# Patient Record
Sex: Female | Born: 1990 | Race: Black or African American | Hispanic: No | Marital: Single | Smoking: Never smoker
Health system: Southern US, Community
[De-identification: ages and names within clinical notes are randomized; demographics above are authoritative.]

## PROBLEM LIST (undated history)

## (undated) DIAGNOSIS — I10 Essential (primary) hypertension: Secondary | ICD-10-CM

## (undated) DIAGNOSIS — D649 Anemia, unspecified: Secondary | ICD-10-CM

## (undated) DIAGNOSIS — E7800 Pure hypercholesterolemia, unspecified: Secondary | ICD-10-CM

## (undated) HISTORY — DX: Pure hypercholesterolemia, unspecified: E78.00

---

## 2017-01-10 HISTORY — PX: TUBAL LIGATION: SHX77

## 2017-07-16 ENCOUNTER — Ambulatory Visit: Payer: Self-pay | Admitting: Obstetrics and Gynecology

## 2017-09-09 ENCOUNTER — Other Ambulatory Visit (HOSPITAL_COMMUNITY)
Admission: RE | Admit: 2017-09-09 | Discharge: 2017-09-09 | Disposition: A | Discharge status: Home / Self Care | Payer: Medicaid Other | Source: Ambulatory Visit | Attending: Obstetrics and Gynecology | Admitting: Obstetrics and Gynecology

## 2017-09-09 ENCOUNTER — Encounter: Payer: Self-pay | Admitting: Obstetrics and Gynecology

## 2017-09-09 ENCOUNTER — Ambulatory Visit (INDEPENDENT_AMBULATORY_CARE_PROVIDER_SITE_OTHER): Payer: Medicaid Other | Admitting: Obstetrics and Gynecology

## 2017-09-09 DIAGNOSIS — Z01411 Encounter for gynecological examination (general) (routine) with abnormal findings: Secondary | ICD-10-CM | POA: Insufficient documentation

## 2017-09-09 DIAGNOSIS — N938 Other specified abnormal uterine and vaginal bleeding: Secondary | ICD-10-CM | POA: Insufficient documentation

## 2017-09-09 DIAGNOSIS — Z202 Contact with and (suspected) exposure to infections with a predominantly sexual mode of transmission: Secondary | ICD-10-CM | POA: Insufficient documentation

## 2017-09-09 DIAGNOSIS — Z309 Encounter for contraceptive management, unspecified: Secondary | ICD-10-CM

## 2017-09-09 MED ORDER — DESOGESTREL-ETHINYL ESTRADIOL 0.15-30 MG-MCG PO TABS: 1.0000 | ORAL_TABLET | Freq: Every day | ORAL | 11 refills | Status: DC

## 2017-09-09 NOTE — Progress Notes (Signed)
Sonya Floyd is a 27 y.o. G63P3003 female here for a routine annual gynecologic exam. She reports a history of irreglar cycles but they have become more irregular since her BTL last May. Cycles are monthly but at different times. Heavy, cramps and clots. Sexual active without problems. H/O c section x 3. Moved here from [place] and does not have a PCP yet.  Gynecologic History Patient's last menstrual period was 08/25/2017. Contraception: tubal ligation Last Pap: 2018. Results were: normal Last mammogram: NA. Results were: NA  Obstetric History OB History  Gravida Para Term Preterm AB Living  3 3 3     3   SAB TAB Ectopic Multiple Live Births          3    # Outcome Date GA Lbr Len/2nd Weight Sex Delivery Anes PTL Lv  3 Term 01/20/15     CS-LTranv   LIV  2 Term 07/11/11     CS-LTranv   LIV  1 Term 11/06/09     CS-LTranv   LIV      Past Medical History:  Diagnosis Date  . High cholesterol     Past Surgical History:  Procedure Laterality Date  . CESAREAN SECTION  11/06/2009  . CESAREAN SECTION  07/11/2011  . CESAREAN SECTION  01/20/2015  . TUBAL LIGATION  01/2017    Current Outpatient Medications on File Prior to Visit  Medication Sig Dispense Refill  . ferrous sulfate 325 (65 FE) MG tablet Take 325 mg by mouth daily with breakfast.    . SUMAtriptan (IMITREX) 50 MG tablet Take 50 mg by mouth every 2 (two) hours as needed for migraine. May repeat in 2 hours if headache persists or recurs.     No current facility-administered medications on file prior to visit.     Not on File  Social History   Socioeconomic History  . Marital status: Single    Spouse name: Not on file  . Number of children: Not on file  . Years of education: Not on file  . Highest education level: Not on file  Social Needs  . Financial resource strain: Not on file  . Food insecurity - worry: Not on file  . Food insecurity - inability: Not on file  . Transportation needs - medical: Not on file  .  Transportation needs - non-medical: Not on file  Occupational History  . Not on file  Tobacco Use  . Smoking status: Never Smoker  . Smokeless tobacco: Never Used  Substance and Sexual Activity  . Alcohol use: Yes    Comment: occ  . Drug use: No  . Sexual activity: Yes    Partners: Male    Birth control/protection: None  Other Topics Concern  . Not on file  Social History Narrative  . Not on file    History reviewed. No pertinent family history.  The following portions of the patient's history were reviewed and updated as appropriate: allergies, current medications, past family history, past medical history, past social history, past surgical history and problem list.  Review of Systems Pertinent items noted in HPI and remainder of comprehensive ROS otherwise negative.   Objective:  BP 131/86   Pulse 73   Ht 5\' 2"  (1.575 m)   Wt 182 lb 9.6 oz (82.8 kg)   LMP 08/25/2017   BMI 33.40 kg/m  CONSTITUTIONAL: Well-developed, well-nourished female in no acute distress.  HENT:  Normocephalic, atraumatic, External right and left ear normal. Oropharynx is clear and moist EYES: Conjunctivae  and EOM are normal. Pupils are equal, round, and reactive to light. No scleral icterus.  NECK: Normal range of motion, supple, no masses.  Normal thyroid.  SKIN: Skin is warm and dry. No rash noted. Not diaphoretic. No erythema. No pallor. NEUROLGIC: Alert and oriented to person, place, and time. Normal reflexes, muscle tone coordination. No cranial nerve deficit noted. PSYCHIATRIC: Normal mood and affect. Normal behavior. Normal judgment and thought content. CARDIOVASCULAR: Normal heart rate noted, regular rhythm RESPIRATORY: Clear to auscultation bilaterally. Effort and breath sounds normal, no problems with respiration noted. BREASTS: Symmetric in size. No masses, skin changes, nipple drainage, or lymphadenopathy. ABDOMEN: Soft, normal bowel sounds, no distention noted.  No tenderness, rebound  or guarding.  PELVIC: Normal appearing external genitalia; normal appearing vaginal mucosa and cervix.  No abnormal discharge noted.  Pap smear obtained.  Normal uterine size, no other palpable masses, no uterine or adnexal tenderness. MUSCULOSKELETAL: Normal range of motion. No tenderness.  No cyanosis, clubbing, or edema.  2+ distal pulses.   Assessment:  Annual gynecologic examination with pap smear STD exposure   DUB   Plan:  Will follow up results of pap smear and manage accordingly. STD testing. Discussed DUB. Hormonal manipulation with OCP's recommended to pt. U/S/B reviewed with pt.  Routine preventative health maintenance measures emphasized. Please refer to After Visit Summary for other counseling recommendations.    Hermina StaggersMichael L Ervin, MD, FACOG Attending Obstetrician & Gynecologist [facility]

## 2017-09-09 NOTE — Patient Instructions (Signed)

## 2017-09-10 LAB — CERVICOVAGINAL ANCILLARY ONLY
Chlamydia: NEGATIVE
Neisseria Gonorrhea: NEGATIVE

## 2017-09-10 LAB — RPR: RPR Ser Ql: NONREACTIVE

## 2017-09-10 LAB — CYTOLOGY - PAP
ADEQUACY: ABSENT
Diagnosis: NEGATIVE

## 2017-09-10 LAB — HIV ANTIBODY (ROUTINE TESTING W REFLEX): HIV Screen 4th Generation wRfx: NONREACTIVE

## 2017-09-16 ENCOUNTER — Ambulatory Visit: Payer: Self-pay | Admitting: Obstetrics and Gynecology

## 2017-10-29 ENCOUNTER — Emergency Department (HOSPITAL_COMMUNITY): Payer: Self-pay

## 2017-10-29 ENCOUNTER — Encounter (HOSPITAL_COMMUNITY): Payer: Self-pay | Admitting: Family Medicine

## 2017-10-29 DIAGNOSIS — R1013 Epigastric pain: Secondary | ICD-10-CM | POA: Insufficient documentation

## 2017-10-29 DIAGNOSIS — Z793 Long term (current) use of hormonal contraceptives: Secondary | ICD-10-CM | POA: Insufficient documentation

## 2017-10-29 DIAGNOSIS — R112 Nausea with vomiting, unspecified: Secondary | ICD-10-CM | POA: Insufficient documentation

## 2017-10-29 LAB — CBC
HEMATOCRIT: 32.6 % — AB (ref 36.0–46.0)
Hemoglobin: 9.7 g/dL — ABNORMAL LOW (ref 12.0–15.0)
MCH: 20.9 pg — ABNORMAL LOW (ref 26.0–34.0)
MCHC: 29.8 g/dL — ABNORMAL LOW (ref 30.0–36.0)
MCV: 70.1 fL — AB (ref 78.0–100.0)
Platelets: ADEQUATE 10*3/uL (ref 150–400)
RBC: 4.65 MIL/uL (ref 3.87–5.11)
RDW: 17.3 % — ABNORMAL HIGH (ref 11.5–15.5)
WBC: 7.9 10*3/uL (ref 4.0–10.5)

## 2017-10-29 LAB — BASIC METABOLIC PANEL
ANION GAP: 8 (ref 5–15)
BUN: 11 mg/dL (ref 6–20)
CO2: 27 mmol/L (ref 22–32)
Calcium: 8.9 mg/dL (ref 8.9–10.3)
Chloride: 111 mmol/L (ref 101–111)
Creatinine, Ser: 0.73 mg/dL (ref 0.44–1.00)
GFR calc non Af Amer: >60 mL/min (ref >60)
Glucose, Bld: 87 mg/dL (ref 65–99)
POTASSIUM: 3.5 mmol/L (ref 3.5–5.1)
SODIUM: 146 mmol/L — AB (ref 135–145)

## 2017-10-29 LAB — I-STAT BETA HCG BLOOD, ED (MC, WL, AP ONLY)

## 2017-10-29 LAB — I-STAT TROPONIN, ED: Troponin i, poc: 0 ng/mL (ref 0.00–0.08)

## 2017-10-29 NOTE — ED Triage Notes (Signed)
Patient is complaining of lower mid sternal chest pain that started while getting dressed on Monday morning. Associated symptoms are shortness of breath, dizziness, lightheadedness, diaphoresis, nausea, vomiting, and back pain. Patient appears in no acute distress. Respirations are even, regular, and unlabored. Skin is warm, dry, and intact.

## 2017-10-30 ENCOUNTER — Emergency Department (HOSPITAL_COMMUNITY)
Admission: EM | Admit: 2017-10-30 | Discharge: 2017-10-30 | Disposition: A | Discharge status: Home / Self Care | Payer: Self-pay | Attending: Emergency Medicine | Admitting: Emergency Medicine

## 2017-10-30 ENCOUNTER — Emergency Department (HOSPITAL_COMMUNITY): Payer: Self-pay

## 2017-10-30 DIAGNOSIS — R1011 Right upper quadrant pain: Secondary | ICD-10-CM

## 2017-10-30 DIAGNOSIS — R1013 Epigastric pain: Secondary | ICD-10-CM

## 2017-10-30 LAB — COMPREHENSIVE METABOLIC PANEL
ALBUMIN: 3.8 g/dL (ref 3.5–5.0)
ALK PHOS: 58 U/L (ref 38–126)
ALT: 42 U/L (ref 14–54)
ANION GAP: 9 (ref 5–15)
AST: 27 U/L (ref 15–41)
BILIRUBIN TOTAL: 0.4 mg/dL (ref 0.3–1.2)
BUN: 10 mg/dL (ref 6–20)
CO2: 26 mmol/L (ref 22–32)
Calcium: 8.7 mg/dL — ABNORMAL LOW (ref 8.9–10.3)
Chloride: 106 mmol/L (ref 101–111)
Creatinine, Ser: 0.67 mg/dL (ref 0.44–1.00)
GFR calc Af Amer: >60 mL/min (ref >60)
GFR calc non Af Amer: >60 mL/min (ref >60)
GLUCOSE: 96 mg/dL (ref 65–99)
Potassium: 3.4 mmol/L — ABNORMAL LOW (ref 3.5–5.1)
Sodium: 141 mmol/L (ref 135–145)
TOTAL PROTEIN: 7.2 g/dL (ref 6.5–8.1)

## 2017-10-30 MED ORDER — OMEPRAZOLE 20 MG PO CPDR: 20.0000 mg | DELAYED_RELEASE_CAPSULE | Freq: Every day | ORAL | 0 refills | Status: DC

## 2017-10-30 MED ORDER — SUCRALFATE 1 G PO TABS: 1.0000 g | ORAL_TABLET | Freq: Three times a day (TID) | ORAL | 0 refills | Status: DC

## 2017-10-30 NOTE — ED Provider Notes (Signed)
[facility]-EMERGENCY DEPT Provider Note   CSN: [id number] Arrival date & time: 10/29/17  2051     History   Chief Complaint Chief Complaint  Patient presents with  . Chest Pain  . Emesis    HPI Sonya Floyd is a 27 y.o. female.  Patient presents to the emergency department with chief complaint of right upper quadrant pain.  She states that the symptoms started a few days ago and have worsened in nature.  She reports having had these symptoms before.  She reports having one episode of vomiting on Monday.  She denies any postprandial pain.  Denies any fevers or chills.  She denies any burning in her throat.  Denies chest pain or shortness of breath.  She has not taken anything for symptoms.  The history is provided by the patient. No language interpreter was used.    Past Medical History:  Diagnosis Date  . High cholesterol     Patient Active Problem List   Diagnosis Date Noted  . Encntr for gyn exam (general) (routine) w abnormal findings 09/09/2017  . DUB (dysfunctional uterine bleeding) 09/09/2017  . Possible exposure to STD 09/09/2017    Past Surgical History:  Procedure Laterality Date  . CESAREAN SECTION  11/06/2009  . CESAREAN SECTION  07/11/2011  . CESAREAN SECTION  01/20/2015  . TUBAL LIGATION  01/2017    OB History    Gravida  3   Para  3   Term  3   Preterm      AB      Living  3     SAB      TAB      Ectopic      Multiple      Live Births  3            Home Medications    Prior to Admission medications   Medication Sig Start Date End Date Taking? Authorizing Provider  desogestrel-ethinyl estradiol (APRI) 0.15-30 MG-MCG tablet Take 1 tablet by mouth daily. 09/09/17   Hermina StaggersErvin, Michael L, MD  ferrous sulfate 325 (65 FE) MG tablet Take 325 mg by mouth daily with breakfast.    [provider]  SUMAtriptan (IMITREX) 50 MG tablet Take 50 mg by mouth every 2 (two) hours as needed for migraine. May repeat  in 2 hours if headache persists or recurs.    [provider]    Family History History reviewed. No pertinent family history.  Social History Social History   Tobacco Use  . Smoking status: Never Smoker  . Smokeless tobacco: Never Used  Substance Use Topics  . Alcohol use: No    Frequency: Never  . Drug use: No     Allergies   Patient has no allergy information on record.   Review of Systems Review of Systems  All other systems reviewed and are negative.    Physical Exam Updated Vital Signs BP (!) 145/95 (BP Location: Left Arm)   Pulse (!) 59   Temp 98.6 F (37 C) (Oral)   Resp 18   Ht 5\' 2"  (1.575 m)   Wt 81.6 kg (180 lb)   LMP 10/23/2017   SpO2 100%   BMI 32.92 kg/m   Physical Exam  Constitutional: She is oriented to person, place, and time. She appears well-developed and well-nourished.  HENT:  Head: Normocephalic and atraumatic.  Eyes: Pupils are equal, round, and reactive to light. Conjunctivae and EOM are normal.  Neck: Normal range of  motion. Neck supple.  Cardiovascular: Normal rate and regular rhythm. Exam reveals no gallop and no friction rub.  No murmur heard. Pulmonary/Chest: Effort normal and breath sounds normal. No respiratory distress. She has no wheezes. She has no rales. She exhibits no tenderness.  Abdominal: Soft. Bowel sounds are normal. She exhibits no distension and no mass. There is tenderness. There is no rebound and no guarding.  Right upper quadrant tender to palpation  Musculoskeletal: Normal range of motion. She exhibits no edema or tenderness.  Neurological: She is alert and oriented to person, place, and time.  Skin: Skin is warm and dry.  Psychiatric: She has a normal mood and affect. Her behavior is normal. Judgment and thought content normal.  Nursing note and vitals reviewed.    ED Treatments / Results  Labs (all labs ordered are listed, but only abnormal results are displayed) Labs Reviewed  BASIC  METABOLIC PANEL - Abnormal; Notable for the following components:      Result Value   Sodium 146 (*)    All other components within normal limits  CBC - Abnormal; Notable for the following components:   Hemoglobin 9.7 (*)    HCT 32.6 (*)    MCV 70.1 (*)    MCH 20.9 (*)    MCHC 29.8 (*)    RDW 17.3 (*)    All other components within normal limits  I-STAT TROPONIN, ED  I-STAT BETA HCG BLOOD, ED ([facility], [facility], [facility] ONLY)    EKG  EKG Interpretation None       Radiology Dg Chest 2 View  Result Date: 10/29/2017 CLINICAL DATA:  Chest pain EXAM: CHEST - 2 VIEW COMPARISON:  None. FINDINGS: The heart size and mediastinal contours are within normal limits. Both lungs are clear. The visualized skeletal structures are unremarkable. IMPRESSION: No active cardiopulmonary disease. Electronically Signed   By: Deatra Robinson M.D.   On: 10/29/2017 22:35   US Abdomen Limited Ruq  Result Date: 10/30/2017 CLINICAL DATA:  Initial evaluation for acute right upper quadrant pain. EXAM: ULTRASOUND ABDOMEN LIMITED RIGHT UPPER QUADRANT COMPARISON:  None. FINDINGS: Gallbladder: No gallstones or wall thickening visualized. No sonographic Murphy sign noted by sonographer. Common bile duct: Diameter: 2.7 mm Liver: No focal lesion identified. Within normal limits in parenchymal echogenicity. Portal vein is patent on color Doppler imaging with normal direction of blood flow towards the liver. IMPRESSION: Normal right upper quadrant ultrasound. Electronically Signed   By: Rise Mu M.D.   On: 10/30/2017 04:19    Procedures Procedures (including critical care time)  Medications Ordered in ED Medications - No data to display   Initial Impression / Assessment and Plan / ED Course  I have reviewed the triage vital signs and the nursing notes.  Pertinent labs & imaging results that were available during my care of the patient were reviewed by me and considered in my medical decision making (see chart for  details).     Patient with 3 days of right upper quadrant pain with associated vomiting, but no fever.  She is tender to palpation.  Will check right upper quadrant ultrasound and LFTs.  Will reassess.  LFTs are normal.  RUQ [place] is normal.  Will treat with omeprazole and carafate.  Recommend GI/PCP follow-up.  Final Clinical Impressions(s) / ED Diagnoses   Final diagnoses:  Epigastric pain    ED Discharge Orders    None       Roxy Horseman, Cordelia Poche 10/30/17 2130    Dione Booze, MD  10/30/17 0647  

## 2019-06-25 DIAGNOSIS — Z7251 High risk heterosexual behavior: Secondary | ICD-10-CM | POA: Diagnosis not present

## 2019-06-29 DIAGNOSIS — Z6830 Body mass index (BMI) 30.0-30.9, adult: Secondary | ICD-10-CM | POA: Diagnosis not present

## 2019-06-29 DIAGNOSIS — Z1151 Encounter for screening for human papillomavirus (HPV): Secondary | ICD-10-CM | POA: Diagnosis not present

## 2019-06-29 DIAGNOSIS — Z01419 Encounter for gynecological examination (general) (routine) without abnormal findings: Secondary | ICD-10-CM | POA: Diagnosis not present

## 2019-06-29 DIAGNOSIS — N760 Acute vaginitis: Secondary | ICD-10-CM | POA: Diagnosis not present

## 2019-09-01 DIAGNOSIS — N920 Excessive and frequent menstruation with regular cycle: Secondary | ICD-10-CM | POA: Diagnosis not present

## 2020-04-27 DIAGNOSIS — Z8616 Personal history of COVID-19: Secondary | ICD-10-CM | POA: Diagnosis not present

## 2020-05-04 ENCOUNTER — Emergency Department (HOSPITAL_COMMUNITY): Payer: BC Managed Care – PPO

## 2020-05-04 ENCOUNTER — Encounter (HOSPITAL_COMMUNITY): Payer: Self-pay | Admitting: Emergency Medicine

## 2020-05-04 ENCOUNTER — Emergency Department (HOSPITAL_COMMUNITY)
Admission: EM | Admit: 2020-05-04 | Discharge: 2020-05-05 | Disposition: A | Discharge status: Home / Self Care | Payer: BC Managed Care – PPO | Attending: Emergency Medicine | Admitting: Emergency Medicine

## 2020-05-04 DIAGNOSIS — R079 Chest pain, unspecified: Secondary | ICD-10-CM | POA: Insufficient documentation

## 2020-05-04 DIAGNOSIS — R0789 Other chest pain: Secondary | ICD-10-CM | POA: Diagnosis not present

## 2020-05-04 DIAGNOSIS — N939 Abnormal uterine and vaginal bleeding, unspecified: Secondary | ICD-10-CM | POA: Diagnosis not present

## 2020-05-04 DIAGNOSIS — D649 Anemia, unspecified: Secondary | ICD-10-CM | POA: Diagnosis not present

## 2020-05-04 DIAGNOSIS — K922 Gastrointestinal hemorrhage, unspecified: Secondary | ICD-10-CM | POA: Insufficient documentation

## 2020-05-04 LAB — BASIC METABOLIC PANEL
Anion gap: 9 (ref 5–15)
BUN: 9 mg/dL (ref 6–20)
CO2: 24 mmol/L (ref 22–32)
Calcium: 9.1 mg/dL (ref 8.9–10.3)
Chloride: 105 mmol/L (ref 98–111)
Creatinine, Ser: 0.76 mg/dL (ref 0.44–1.00)
GFR calc Af Amer: >60 mL/min (ref >60)
GFR calc non Af Amer: >60 mL/min (ref >60)
Glucose, Bld: 129 mg/dL — ABNORMAL HIGH (ref 70–99)
Potassium: 3.5 mmol/L (ref 3.5–5.1)
Sodium: 138 mmol/L (ref 135–145)

## 2020-05-04 LAB — TROPONIN I (HIGH SENSITIVITY): Troponin I (High Sensitivity): 3 ng/L (ref <18)

## 2020-05-04 LAB — CBC
HCT: 25.1 % — ABNORMAL LOW (ref 36.0–46.0)
Hemoglobin: 6.1 g/dL — CL (ref 12.0–15.0)
MCH: 14.2 pg — ABNORMAL LOW (ref 26.0–34.0)
MCHC: 24.3 g/dL — ABNORMAL LOW (ref 30.0–36.0)
MCV: 58.4 fL — ABNORMAL LOW (ref 80.0–100.0)
Platelets: 423 10*3/uL — ABNORMAL HIGH (ref 150–400)
RBC: 4.3 MIL/uL (ref 3.87–5.11)
RDW: 22.9 % — ABNORMAL HIGH (ref 11.5–15.5)
WBC: 8.4 10*3/uL (ref 4.0–10.5)
nRBC: 0 % (ref 0.0–0.2)

## 2020-05-04 NOTE — ED Triage Notes (Signed)
Pt c/o left sided chest pressure/heaviness.  Pt also reports headache, lightheadedness and nausea.  Pt had COVID symptoms on Sept 4th, tested positive on the 8th.

## 2020-05-05 ENCOUNTER — Other Ambulatory Visit: Payer: Self-pay

## 2020-05-05 DIAGNOSIS — R079 Chest pain, unspecified: Secondary | ICD-10-CM | POA: Diagnosis not present

## 2020-05-05 LAB — TROPONIN I (HIGH SENSITIVITY): Troponin I (High Sensitivity): 3 ng/L (ref <18)

## 2020-05-05 LAB — POC OCCULT BLOOD, ED: Fecal Occult Bld: POSITIVE — AB

## 2020-05-05 MED ORDER — PANTOPRAZOLE SODIUM 40 MG PO TBEC: 40.0000 mg | DELAYED_RELEASE_TABLET | Freq: Every day | ORAL | 3 refills | Status: DC

## 2020-05-05 MED ORDER — FERROUS SULFATE 325 (65 FE) MG PO TABS: 325.0000 mg | ORAL_TABLET | Freq: Two times a day (BID) | ORAL | 3 refills | Status: DC

## 2020-05-05 MED ORDER — SUCRALFATE 1 G PO TABS: 1.0000 g | ORAL_TABLET | Freq: Three times a day (TID) | ORAL | 0 refills | Status: DC

## 2020-05-05 NOTE — ED Provider Notes (Signed)
[facility] EMERGENCY DEPARTMENT Provider Note   CSN: [id number] Arrival date & time: 05/04/20  2056     History Chief Complaint  Patient presents with  . Chest Pain  . Headache    Sonya Floyd is a 29 y.o. female.  Patient presents to the emergency department with complaints of chest pain.  Patient experiencing a heaviness and sharp pain across the upper chest intermittently today.  She reports that she has had some dizziness and feels very weak.        Past Medical History:  Diagnosis Date  . High cholesterol     Patient Active Problem List   Diagnosis Date Noted  . Encntr for gyn exam (general) (routine) w abnormal findings 09/09/2017  . DUB (dysfunctional uterine bleeding) 09/09/2017  . Possible exposure to STD 09/09/2017    Past Surgical History:  Procedure Laterality Date  . CESAREAN SECTION  11/06/2009  . CESAREAN SECTION  07/11/2011  . CESAREAN SECTION  01/20/2015  . TUBAL LIGATION  01/2017     OB History    Gravida  3   Para  3   Term  3   Preterm      AB      Living  3     SAB      TAB      Ectopic      Multiple      Live Births  3           No family history on file.  Social History   Tobacco Use  . Smoking status: Never Smoker  . Smokeless tobacco: Never Used  Vaping Use  . Vaping Use: Never used  Substance Use Topics  . Alcohol use: No  . Drug use: No    Home Medications Prior to Admission medications   Medication Sig Start Date End Date Taking? Authorizing Provider  acetaminophen (TYLENOL) 500 MG tablet Take 1,000 mg by mouth every 6 (six) hours as needed for mild pain.   Yes [provider]  ascorbic acid (VITAMIN C) 500 MG tablet Take 500 mg by mouth daily.   Yes [provider]  cholecalciferol (VITAMIN D3) 25 MCG (1000 UNIT) tablet Take 1,000 Units by mouth daily.   Yes [provider]  ibuprofen (ADVIL) 200 MG tablet Take 200-400 mg by mouth every 6 (six)  hours as needed for headache.   Yes [provider]  ferrous sulfate 325 (65 FE) MG tablet Take 1 tablet (325 mg total) by mouth 2 (two) times daily with a meal. 05/05/20   Kiasia Chou, Canary Brim, MD  pantoprazole (PROTONIX) 40 MG tablet Take 1 tablet (40 mg total) by mouth daily. 05/05/20   Gilda Crease, MD  sucralfate (CARAFATE) 1 g tablet Take 1 tablet (1 g total) by mouth with breakfast, with lunch, and with evening meal. 05/05/20   Lalah Durango, Canary Brim, MD    Allergies    Patient has no known allergies.  Review of Systems   Review of Systems  Cardiovascular: Positive for chest pain.  Genitourinary: Positive for vaginal bleeding.  Neurological: Positive for dizziness.  All other systems reviewed and are negative.   Physical Exam Updated Vital Signs BP 121/84   Pulse (!) 56   Temp 98.2 F (36.8 C) (Oral)   Resp (!) 22   Ht 5\' 2"  (1.575 m)   Wt 77.1 kg   LMP 05/01/2020 (Within Days)   SpO2 100%   BMI 31.09 kg/m  Physical Exam Vitals and nursing note reviewed.  Constitutional:      General: She is not in acute distress.    Appearance: Normal appearance. She is well-developed.  HENT:     Head: Normocephalic and atraumatic.     Right Ear: Hearing normal.     Left Ear: Hearing normal.     Nose: Nose normal.  Eyes:     Conjunctiva/sclera: Conjunctivae normal.     Pupils: Pupils are equal, round, and reactive to light.  Cardiovascular:     Rate and Rhythm: Regular rhythm.     Heart sounds: S1 normal and S2 normal. No murmur heard.  No friction rub. No gallop.   Pulmonary:     Effort: Pulmonary effort is normal. No respiratory distress.     Breath sounds: Normal breath sounds.  Chest:     Chest wall: No tenderness.  Abdominal:     General: Bowel sounds are normal.     Palpations: Abdomen is soft.     Tenderness: There is no abdominal tenderness. There is no guarding or rebound. Negative signs include Murphy's sign and McBurney's sign.     Hernia:  No hernia is present.  Musculoskeletal:        General: Normal range of motion.     Cervical back: Normal range of motion and neck supple.  Skin:    General: Skin is warm and dry.     Findings: No rash.  Neurological:     Mental Status: She is alert and oriented to person, place, and time.     GCS: GCS eye subscore is 4. GCS verbal subscore is 5. GCS motor subscore is 6.     Cranial Nerves: No cranial nerve deficit.     Sensory: No sensory deficit.     Coordination: Coordination normal.  Psychiatric:        Speech: Speech normal.        Behavior: Behavior normal.        Thought Content: Thought content normal.     ED Results / Procedures / Treatments   Labs (all labs ordered are listed, but only abnormal results are displayed) Labs Reviewed  BASIC METABOLIC PANEL - Abnormal; Notable for the following components:      Result Value   Glucose, Bld 129 (*)    All other components within normal limits  CBC - Abnormal; Notable for the following components:   Hemoglobin 6.1 (*)    HCT 25.1 (*)    MCV 58.4 (*)    MCH 14.2 (*)    MCHC 24.3 (*)    RDW 22.9 (*)    Platelets 423 (*)    All other components within normal limits  POC OCCULT BLOOD, ED - Abnormal; Notable for the following components:   Fecal Occult Bld POSITIVE (*)    All other components within normal limits  I-STAT BETA HCG BLOOD, ED ([facility], [facility], [facility] ONLY)  TROPONIN I (HIGH SENSITIVITY)  TROPONIN I (HIGH SENSITIVITY)    EKG EKG Interpretation  Date/Time:  Friday May 05 2020 03:27:53 EDT Ventricular Rate:  62 PR Interval:    QRS Duration: 100 QT Interval:  424 QTC Calculation: 431 R Axis:   60 Text Interpretation: Sinus rhythm Borderline ST elevation, lateral leads No significant change since last tracing Confirmed by Gilda Crease [phone]) on 05/05/2020 3:29:57 AM   Radiology DG Chest 2 View  Result Date: 05/04/2020 CLINICAL DATA:  Chest pain EXAM: CHEST - 2 VIEW COMPARISON:  None. FINDINGS:  The heart size and mediastinal contours are within normal limits. Both lungs are clear. The visualized skeletal structures are unremarkable. IMPRESSION: No active cardiopulmonary disease. Electronically Signed   By: Helyn Numbers MD   On: 05/04/2020 22:12    Procedures Procedures (including critical care time)  Medications Ordered in ED Medications - No data to display  ED Course  I have reviewed the triage vital signs and the nursing notes.  Pertinent labs & imaging results that were available during my care of the patient were reviewed by me and considered in my medical decision making (see chart for details).    MDM Rules/Calculators/A&P                          Patient presents to the emergency department for evaluation of chest pain.  Patient does report concomitant dizziness and does feel weak and like her heart races when she gets up and moves around.  Patient noted to be fairly anemic today.  She has a history of abnormal uterine bleeding.  She is currently menstruating.  Reviewing her records, however, reveals previous visit to the ED for epigastric discomfort ureteral reflux.  She has never followed up, never been diagnosed with ulcers.  No hematemesis.  Rectal exam performed.  Stool was brown but heme positive.   Chest pain does seem atypical.  She does not have any significant cardiac risk factors.  No shortness of breath.  No tachycardia.  PERC negative.  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  I am, however concerned that she might have increased anemia secondary to upper GI bleed.  I discussed these findings and my concerns with the patient.  I recommended she receive transfusion of blood and be admitted for further work-up.  Patient declines both.  She understands the seriousness of her condition and that increasing blood loss could cause severe morbidity and even death.  She chooses to sign out AGAINST MEDICAL ADVICE.  Will provide  iron, Protonix and GI follow-up.  She was given extensive counseling on the signs of worsening anemia for which to return to the ER.  Final Clinical Impression(s) / ED Diagnoses Final diagnoses:  Chest pain, unspecified type  Symptomatic anemia  Abnormal uterine bleeding  Upper GI bleeding    Rx / DC Orders ED Discharge Orders         Ordered    ferrous sulfate 325 (65 FE) MG tablet  2 times daily with meals        05/05/20 0454    pantoprazole (PROTONIX) 40 MG tablet  Daily        05/05/20 0454    sucralfate (CARAFATE) 1 g tablet  3 times daily with meals        05/05/20 0454           Gilda Crease, MD 05/05/20 [phone]

## 2020-05-05 NOTE — Discharge Instructions (Signed)
If you have any increased bleeding or symptoms of chest pain, dizziness, shortness of breath or passing out please return to the ER.  Please call your OB/GYN doctor to follow-up for your abnormal vaginal bleeding.  Please call Dr. Meridee Score, a GI doctor to be seen in the office to determine if you are experiencing an ulcer or other cause of GI bleeding.

## 2020-10-27 ENCOUNTER — Encounter (HOSPITAL_COMMUNITY): Payer: Self-pay

## 2020-10-27 ENCOUNTER — Emergency Department (HOSPITAL_COMMUNITY)
Admission: EM | Admit: 2020-10-27 | Discharge: 2020-10-28 | Disposition: A | Discharge status: Home / Self Care | Payer: BC Managed Care – PPO | Attending: Emergency Medicine | Admitting: Emergency Medicine

## 2020-10-27 ENCOUNTER — Other Ambulatory Visit: Payer: Self-pay

## 2020-10-27 DIAGNOSIS — N938 Other specified abnormal uterine and vaginal bleeding: Secondary | ICD-10-CM | POA: Insufficient documentation

## 2020-10-27 DIAGNOSIS — D649 Anemia, unspecified: Secondary | ICD-10-CM | POA: Insufficient documentation

## 2020-10-27 NOTE — ED Triage Notes (Signed)
Pt was seen by PCP today and got blood work, told her Hgb was 7.6. reports weakness and fatigue x 2-22mo getting worse with her last period.

## 2020-10-27 NOTE — ED Provider Notes (Signed)
[facility]-EMERGENCY DEPT Provider Note   CSN: [id number] Arrival date & time: 10/27/20  2250     History Chief Complaint  Patient presents with  . Vaginal Bleeding    Sonya Floyd is a 30 y.o. female.  30 year old female with a history of dysfunctional uterine bleeding presents to the emergency department at the advice of her primary care doctor for low hemoglobin.  She reports chief complaint of fatigue and generalized weakness.  Symptoms have been progressive over the past 2 to 3 months.  She has episodes of chest tightness and dyspnea on exertion as well as lightheadedness.  No associated syncope. Was seen by Rocco Pauls and offered OCPs for her menorrhagia, but declined. Continues to experience heavy and prolonged menses. Has only required the use of panty liners today; LMP 10/17/20. Hgb 7.6 at PCP office today.  The history is provided by the patient. No language interpreter was used.  Vaginal Bleeding      Past Medical History:  Diagnosis Date  . High cholesterol     Patient Active Problem List   Diagnosis Date Noted  . Encntr for gyn exam (general) (routine) w abnormal findings 09/09/2017  . DUB (dysfunctional uterine bleeding) 09/09/2017  . Possible exposure to STD 09/09/2017    Past Surgical History:  Procedure Laterality Date  . CESAREAN SECTION  11/06/2009  . CESAREAN SECTION  07/11/2011  . CESAREAN SECTION  01/20/2015  . TUBAL LIGATION  01/2017     OB History    Gravida  3   Para  3   Term  3   Preterm      AB      Living  3     SAB      IAB      Ectopic      Multiple      Live Births  3           History reviewed. No pertinent family history.  Social History   Tobacco Use  . Smoking status: Never Smoker  . Smokeless tobacco: Never Used  Vaping Use  . Vaping Use: Never used  Substance Use Topics  . Alcohol use: No  . Drug use: No    Home Medications Prior to Admission medications    Medication Sig Start Date End Date Taking? Authorizing Provider  acetaminophen (TYLENOL) 500 MG tablet Take 1,000 mg by mouth every 6 (six) hours as needed for mild pain.   Yes [provider]  docusate sodium (COLACE) 100 MG capsule Take 1 capsule (100 mg total) by mouth every 12 (twelve) hours. 10/28/20  Yes Antony Madura, PA-C  ferrous sulfate 325 (65 FE) MG tablet Take 1 tablet (325 mg total) by mouth 2 (two) times daily with a meal. 10/28/20  Yes Antony Madura, PA-C  ibuprofen (ADVIL) 200 MG tablet Take 200-400 mg by mouth every 6 (six) hours as needed for headache.   Yes [provider]  norgestimate-ethinyl estradiol (SPRINTEC 28) 0.25-35 MG-MCG tablet Take 1 tablet by mouth 3 (three) times daily for 3 days, THEN 1 tablet 2 (two) times daily for 3 days, THEN 1 tablet daily. Take 1 tablet three times a day on day 1-3 Take 1 tablet twice a day on day 4-6 Then take 1 tablet once a day every day thereafter.. 10/28/20 12/03/20 Yes Antony Madura, PA-C    Allergies    Patient has no known allergies.  Review of Systems   Review of Systems  Genitourinary: Positive for  vaginal bleeding.  Ten systems reviewed and are negative for acute change, except as noted in the HPI.    Physical Exam Updated Vital Signs BP 115/86   Pulse 65   Temp 98.1 F (36.7 C) (Oral)   Resp 16   Ht 5\' 3"  (1.6 m)   Wt 80.7 kg   LMP 10/19/2020   SpO2 99%   BMI 31.53 kg/m   Physical Exam Vitals and nursing note reviewed.  Constitutional:      General: She is not in acute distress.    Appearance: She is well-developed. She is not diaphoretic.     Comments: Nontoxic appearing and in NAD  HENT:     Head: Normocephalic and atraumatic.  Eyes:     General: No scleral icterus.    Conjunctiva/sclera: Conjunctivae normal.  Pulmonary:     Effort: Pulmonary effort is normal. No respiratory distress.     Comments: Respirations even and unlabored Musculoskeletal:        General: Normal range of  motion.     Cervical back: Normal range of motion.  Skin:    General: Skin is warm and dry.     Coloration: Skin is not pale.     Findings: No erythema or rash.  Neurological:     Mental Status: She is alert and oriented to person, place, and time.     Coordination: Coordination normal.  Psychiatric:        Behavior: Behavior normal.     ED Results / Procedures / Treatments   Labs (all labs ordered are listed, but only abnormal results are displayed) Labs Reviewed  CBC WITH DIFFERENTIAL/PLATELET - Abnormal; Notable for the following components:      Result Value   Hemoglobin 7.2 (*)    HCT 28.3 (*)    MCV 59.5 (*)    MCH 15.1 (*)    MCHC 25.4 (*)    RDW 21.3 (*)    Platelets 429 (*)    All other components within normal limits  PROTIME-INR  TYPE AND SCREEN  PREPARE RBC (CROSSMATCH)  ABO/RH    EKG None  Radiology No results found.  Procedures Procedures   Medications Ordered in ED Medications  0.9 %  sodium chloride infusion (10 mL/hr Intravenous New Bag/Given 10/28/20 0358)    ED Course  I have reviewed the triage vital signs and the nursing notes.  Pertinent labs & imaging results that were available during my care of the patient were reviewed by me and considered in my medical decision making (see chart for details).  Clinical Course as of 10/28/20 0641  Sat Oct 28, 2020  0137 Hemoglobin returned at 7.2.  Given dyspnea on exertion, persistent fatigue plan to transfuse in the ED with 1 unit PRBCs for symptomatic anemia. [KH]  0410 Transfusion has been started. [KH]  0138 Spoke with Dr. [id number] of [facility].  She recommends OCP taper as well as iron and stool softeners.  Reiterates patient's need to call the office on Monday to schedule a follow-up visit.  Dr. Monday will also send a message to her office RN to reach out to the patient regarding follow-up. [KH]    Clinical Course User Index [KH] Amado Nash   MDM Rules/Calculators/A&P                           30 year old female presents to the emergency department for fatigue and generalized weakness with dyspnea on exertion.  Sent in after PCP found hemoglobin to be 7.6.  Anemia confirmed in the ED with hemoglobin of 7.2.  However, this is more of an acute on chronic process.  She is hemodynamically stable without tachycardia, hypotension, hypoxia.  Patient ordered to receive 1 unit PRBCs for management of symptomatic anemia.  She has been followed by [facility] in the past.  Dr. Amado Nash recommends starting Sprintec taper as well as iron tablets.  Stresses need for patient to follow-up in the office closely.  Plan for discharge upon completion of PRBC transfusion.  Care signed out to [place], PA-C at shift change.   Final Clinical Impression(s) / ED Diagnoses Final diagnoses:  Symptomatic anemia  Dysfunctional uterine bleeding    Rx / DC Orders ED Discharge Orders         Ordered    norgestimate-ethinyl estradiol (SPRINTEC 28) 0.25-35 MG-MCG tablet        10/28/20 0630    ferrous sulfate 325 (65 FE) MG tablet  2 times daily with meals        10/28/20 0630    docusate sodium (COLACE) 100 MG capsule  Every 12 hours        10/28/20 0630           Antony Madura, PA-C 10/28/20 0641    Mol[place], Jonny Ruiz, MD 10/28/20 [phone]

## 2020-10-28 DIAGNOSIS — D649 Anemia, unspecified: Secondary | ICD-10-CM | POA: Diagnosis not present

## 2020-10-28 LAB — BASIC METABOLIC PANEL
Anion gap: 10 (ref 5–15)
BUN: 15 mg/dL (ref 6–20)
CO2: 22 mmol/L (ref 22–32)
Calcium: 9.1 mg/dL (ref 8.9–10.3)
Chloride: 104 mmol/L (ref 98–111)
Creatinine, Ser: 0.8 mg/dL (ref 0.44–1.00)
GFR, Estimated: >60 mL/min (ref >60)
Glucose, Bld: 91 mg/dL (ref 70–99)
Potassium: 3.7 mmol/L (ref 3.5–5.1)
Sodium: 136 mmol/L (ref 135–145)

## 2020-10-28 LAB — CBC WITH DIFFERENTIAL/PLATELET
Abs Immature Granulocytes: 0.01 10*3/uL (ref 0.00–0.07)
Basophils Absolute: 0.1 10*3/uL (ref 0.0–0.1)
Basophils Relative: 1 %
Eosinophils Absolute: 0.2 10*3/uL (ref 0.0–0.5)
Eosinophils Relative: 2 %
HCT: 28.3 % — ABNORMAL LOW (ref 36.0–46.0)
Hemoglobin: 7.2 g/dL — ABNORMAL LOW (ref 12.0–15.0)
Immature Granulocytes: 0 %
Lymphocytes Relative: 32 %
Lymphs Abs: 3.2 10*3/uL (ref 0.7–4.0)
MCH: 15.1 pg — ABNORMAL LOW (ref 26.0–34.0)
MCHC: 25.4 g/dL — ABNORMAL LOW (ref 30.0–36.0)
MCV: 59.5 fL — ABNORMAL LOW (ref 80.0–100.0)
Monocytes Absolute: 0.6 10*3/uL (ref 0.1–1.0)
Monocytes Relative: 6 %
Neutro Abs: 5.8 10*3/uL (ref 1.7–7.7)
Neutrophils Relative %: 59 %
Platelets: 429 10*3/uL — ABNORMAL HIGH (ref 150–400)
RBC: 4.76 MIL/uL (ref 3.87–5.11)
RDW: 21.3 % — ABNORMAL HIGH (ref 11.5–15.5)
WBC: 9.9 10*3/uL (ref 4.0–10.5)
nRBC: 0 % (ref 0.0–0.2)

## 2020-10-28 LAB — PREPARE RBC (CROSSMATCH)

## 2020-10-28 LAB — PROTIME-INR
INR: 1 (ref 0.8–1.2)
Prothrombin Time: 13.2 seconds (ref 11.4–15.2)

## 2020-10-28 LAB — ABO/RH: ABO/RH(D): A POS

## 2020-10-28 MED ORDER — SODIUM CHLORIDE 0.9 % IV SOLN
10.0000 mL/h | Freq: Once | INTRAVENOUS | Status: AC
  Administered 2020-10-28: 10 mL/h via INTRAVENOUS

## 2020-10-28 MED ORDER — FERROUS SULFATE 325 (65 FE) MG PO TABS: 325.0000 mg | ORAL_TABLET | Freq: Two times a day (BID) | ORAL | 1 refills | Status: DC

## 2020-10-28 MED ORDER — NORGESTIMATE-ETH ESTRADIOL 0.25-35 MG-MCG PO TABS: ORAL_TABLET | ORAL | 0 refills | Status: DC

## 2020-10-28 MED ORDER — DOCUSATE SODIUM 100 MG PO CAPS: 100.0000 mg | ORAL_CAPSULE | Freq: Two times a day (BID) | ORAL | 0 refills | Status: DC

## 2020-10-28 NOTE — ED Notes (Signed)
Pt sit to stand and ambulated with no assistance. Pt denies dizziness or shortness of breath upon standing. Pt denied dizziness upon ambulating as well

## 2020-10-28 NOTE — Discharge Instructions (Signed)
Call [facility] on Monday morning to schedule close follow-up in their office.  You have been recommended to start a taper of Sprintec.  Take as prescribed.  You would also benefit from taking iron twice a day.  This may make you constipated for which you were given a prescription for Colace, a stool softener.  You may return to the ED for any new or concerning symptoms.

## 2020-10-28 NOTE — ED Provider Notes (Signed)
Assumed care from [facility], PA-C at shift change pending blood transfusion.  See her note for full HPI.  In short, patient is a 30 year old female with history of dysfunctional uterine bleeding who presents to the ED due to low hemoglobin.  Patient admits to fatigue, generalized weakness, and dyspnea on exertion that expressly worsened over the past 2 to 3 months. Patient's LMP 10/17/20. Patient was sent to the ED due to hemoglobin at PCP office of 7.6  Plan from previous provider: Discharge with OCP taper, iron, and stool softeners after transfusion.   ED Course/Procedures  . Results for orders placed or performed during the hospital encounter of 10/27/20 (from the past 24 hour(s))  CBC with Differential     Status: Abnormal   Collection Time: 10/28/20 12:30 AM  Result Value Ref Range   WBC 9.9 4.0 - 10.5 K/uL   RBC 4.76 3.87 - 5.11 MIL/uL   Hemoglobin 7.2 (L) 12.0 - 15.0 g/dL   HCT 71.2 (L) 45.8 - 09.9 %   MCV 59.5 (L) 80.0 - 100.0 fL   MCH 15.1 (L) 26.0 - 34.0 pg   MCHC 25.4 (L) 30.0 - 36.0 g/dL   RDW 83.3 (H) 82.5 - 05.3 %   Platelets 429 (H) 150 - 400 K/uL   nRBC 0.0 0.0 - 0.2 %   Neutrophils Relative % 59 %   Neutro Abs 5.8 1.7 - 7.7 K/uL   Lymphocytes Relative 32 %   Lymphs Abs 3.2 0.7 - 4.0 K/uL   Monocytes Relative 6 %   Monocytes Absolute 0.6 0.1 - 1.0 K/uL   Eosinophils Relative 2 %   Eosinophils Absolute 0.2 0.0 - 0.5 K/uL   Basophils Relative 1 %   Basophils Absolute 0.1 0.0 - 0.1 K/uL   Immature Granulocytes 0 %   Abs Immature Granulocytes 0.01 0.00 - 0.07 K/uL   Polychromasia PRESENT    Target Cells PRESENT    Ovalocytes PRESENT   Protime-INR     Status: None   Collection Time: 10/28/20 12:30 AM  Result Value Ref Range   Prothrombin Time 13.2 11.4 - 15.2 seconds   INR 1.0 0.8 - 1.2  Type and screen     Status: None (Preliminary result)   Collection Time: 10/28/20 12:30 AM  Result Value Ref Range   ABO/RH(D) A POS    Antibody Screen NEG    Sample  Expiration 10/31/2020,2359    Unit Number [id number]    Blood Component Type RED CELLS,LR    Unit division 00    Status of Unit ISSUED    Transfusion Status OK TO TRANSFUSE    Crossmatch Result      Compatible Performed at [facility], [address], [place], [place] [address]   Prepare RBC (crossmatch)     Status: None   Collection Time: 10/28/20  1:37 AM  Result Value Ref Range   Order Confirmation      ORDER PROCESSED BY BLOOD BANK Performed at [facility], [address] [place], [place] [address]   ABO/Rh     Status: None   Collection Time: 10/28/20  2:47 AM  Result Value Ref Range   ABO/RH(D) A POS    No rh immune globuloin      NOT A RH IMMUNE GLOBULIN CANDIDATE, PT RH POSITIVE Performed at [facility], [address] [place], [place] [address]     Clinical Course as of 10/28/20 0631  Sat Oct 28, 2020  0137 Hemoglobin returned at 7.2.  Given dyspnea on exertion, persistent fatigue plan to transfuse in the ED with 1 unit PRBCs for symptomatic anemia. [KH]  0410 Transfusion has been started. [KH]  5638 Spoke with Dr. Amado Nash of [facility].  She recommends OCP taper as well as iron and stool softeners.  Reiterates patient's need to call the office on Monday to schedule a follow-up visit.  Dr. Amado Nash will also send a message to her office RN to reach out to the patient regarding follow-up. [KH]    Clinical Course User Index [KH] Antony Madura, PA-C    Procedures  MDM  Assumed care from [facility], PA-C at shift change pending blood transfusion. See her note for full MDM.  30 year old female presents to the ED due to fatigue, generalized weakness, dyspnea on exertion and was found to have a hemoglobin of 7.6 at her PCP office yesterday and advised to report to the ED. Hemoglobin here in the ED was 7.2; however, appears to be an acute on chronic issue.  Denies melena, hematochezia, and hematemesis.  LMP 10/17/20.  Patient observed here in the ER for over 9 hours and remained hemodynamically stable with one episode of tachycardia likely not reliable given all other readings are bradycardic or lower end of normal HR which appears to be around patient's baseline.  Previous provider spoke to on-call OB/GYN at [facility] who recommended OCP taper as well as iron and stool softeners.  7:19 AM reassessed patient at bedside who notes improvement in symptoms after transfusion. Patient ambulated here in the ED without difficulty and no complaints of shortness of breath or lightheadedness. [facility] number given to patient at discharge.  Advised patient to call on Monday to schedule point for further evaluation. Discharge paperwork completed by previous provider. Strict ED precautions discussed with patient. Patient states understanding and agrees to plan. Patient discharged home in no acute distress and stable vitals     Jesusita Oka 10/28/20 0748    Koleen Distance, MD 10/28/20 [phone]

## 2020-10-29 LAB — TYPE AND SCREEN
ABO/RH(D): A POS
Antibody Screen: NEGATIVE
Unit division: 0

## 2020-10-29 LAB — BPAM RBC
Blood Product Expiration Date: 202204132359
ISSUE DATE / TIME: 202203190405
Unit Type and Rh: 6200

## 2021-04-06 ENCOUNTER — Other Ambulatory Visit: Payer: Self-pay

## 2021-04-06 ENCOUNTER — Encounter (HOSPITAL_BASED_OUTPATIENT_CLINIC_OR_DEPARTMENT_OTHER): Payer: Self-pay | Admitting: *Deleted

## 2021-04-06 ENCOUNTER — Emergency Department (HOSPITAL_BASED_OUTPATIENT_CLINIC_OR_DEPARTMENT_OTHER)
Admission: EM | Admit: 2021-04-06 | Discharge: 2021-04-06 | Disposition: A | Discharge status: Home / Self Care | Payer: BC Managed Care – PPO | Attending: Emergency Medicine | Admitting: Emergency Medicine

## 2021-04-06 DIAGNOSIS — M7661 Achilles tendinitis, right leg: Secondary | ICD-10-CM | POA: Insufficient documentation

## 2021-04-06 DIAGNOSIS — M79604 Pain in right leg: Secondary | ICD-10-CM | POA: Diagnosis present

## 2021-04-06 MED ORDER — NAPROXEN 250 MG PO TABS
500.0000 mg | ORAL_TABLET | Freq: Once | ORAL | Status: AC
  Administered 2021-04-06: 500 mg via ORAL
  Filled 2021-04-06: qty 2

## 2021-04-06 MED ORDER — NAPROXEN 375 MG PO TABS: ORAL_TABLET | ORAL | 0 refills | Status: DC

## 2021-04-06 NOTE — ED Provider Notes (Signed)
[facility] Provider Note: Lowella Dell, MD, FACEP  CSN: [id number] MRN: [id number] ARRIVAL: 04/06/21 at 0024 ROOM: MH12/MH12   CHIEF COMPLAINT  Leg Pain   HISTORY OF PRESENT ILLNESS  04/06/21 2:56 AM Sonya Floyd is a 30 y.o. female is of pain in her right lower leg.  The pain is located in her right calf including her right Achilles tendon, her right heel and her right plantar fascia.  She rates the pain as an 8 out of 10 and it is worse with dorsiflexion of the foot or with ambulation.  She gets relief when walking on her tiptoes.  She has not taken anything for this.  She has not noticed slight swelling of the right ankle with this.   Past Medical History:  Diagnosis Date   High cholesterol     Past Surgical History:  Procedure Laterality Date   CESAREAN SECTION  11/06/2009   CESAREAN SECTION  07/11/2011   CESAREAN SECTION  01/20/2015   TUBAL LIGATION  01/2017    No family history on file.  Social History   Tobacco Use   Smoking status: Never   Smokeless tobacco: Never  Vaping Use   Vaping Use: Never used  Substance Use Topics   Alcohol use: No   Drug use: No    Prior to Admission medications   Medication Sig Start Date End Date Taking? Authorizing Provider  naproxen (NAPROSYN) 375 MG tablet Take 1 tablet twice daily for right leg pain. 04/06/21  Yes Stace Peace, MD  docusate sodium (COLACE) 100 MG capsule Take 1 capsule (100 mg total) by mouth every 12 (twelve) hours. 10/28/20   Antony Madura, PA-C  ferrous sulfate 325 (65 FE) MG tablet Take 1 tablet (325 mg total) by mouth 2 (two) times daily with a meal. 10/28/20   Antony Madura, PA-C  norgestimate-ethinyl estradiol (SPRINTEC 28) 0.25-35 MG-MCG tablet Take 1 tablet by mouth 3 (three) times daily for 3 days, THEN 1 tablet 2 (two) times daily for 3 days, THEN 1 tablet daily. Take 1 tablet three times a day on day 1-3 Take 1 tablet twice a day on day 4-6 Then take 1 tablet once a day every day  thereafter.. 10/28/20 12/03/20  Antony Madura, PA-C    Allergies Patient has no known allergies.   REVIEW OF SYSTEMS  Negative except as noted here or in the History of Present Illness.   PHYSICAL EXAMINATION  Initial Vital Signs Blood pressure (!) 156/105, pulse 68, temperature 98.9 F (37.2 C), temperature source Oral, resp. rate 16, height 5\' 2"  (1.575 m), weight 81.6 kg, last menstrual period 03/12/2021, SpO2 100 %.  Examination General: Well-developed, well-nourished female in no acute distress; appearance consistent with age of record HENT: normocephalic; atraumatic Eyes: Normal appearance Neck: supple Heart: regular rate and rhythm Lungs: clear to auscultation bilaterally Abdomen: soft; nondistended; nontender; bowel sounds present Extremities: No deformity; tenderness of right calf, right Achilles tendon and right heel without significant edema; negative right Thompson test with no palpable Achilles tendon defect Neurologic: Awake, alert and oriented; motor function intact in all extremities and symmetric; no facial droop Skin: Warm and dry; superficial varicose veins of right thigh Psychiatric: Normal mood and affect   RESULTS  Summary of this visit's results, reviewed and interpreted by myself:   EKG Interpretation  Date/Time:    Ventricular Rate:    PR Interval:    QRS Duration:   QT Interval:    QTC Calculation:   R Axis:  Text Interpretation:         Laboratory Studies: No results found for this or any previous visit (from the past 24 hour(s)). Imaging Studies: No results found.  ED COURSE and MDM  Nursing notes, initial and subsequent vitals signs, including pulse oximetry, reviewed and interpreted by myself.  Vitals:   04/06/21 0037 04/06/21 0039  BP:  (!) 156/105  Pulse:  68  Resp:  16  Temp:  98.9 F (37.2 C)  TempSrc:  Oral  SpO2:  100%  Weight: 81.6 kg   Height: 5\' 2"  (1.575 m)    Medications  naproxen (NAPROSYN) tablet 500 mg  (has no administration in time range)    Presentation consistent with Achilles tendinitis.  I have a very low suspicion of DVT.  The pain is located along with the course of the Achilles tendon and is not associated with the varicose veins of her thigh which are superficial and nonthrombosed.  PROCEDURES  Procedures   ED DIAGNOSES     ICD-10-CM   1. Achilles tendinitis of right lower extremity  M76.61          Patra Gherardi, , MD 04/06/21 [phone]

## 2021-04-06 NOTE — ED Triage Notes (Signed)
C/o right foot and leg pain increased pain with varicose veins x 4 days

## 2021-05-21 ENCOUNTER — Other Ambulatory Visit: Payer: Self-pay

## 2021-05-21 ENCOUNTER — Emergency Department (HOSPITAL_COMMUNITY)
Admission: EM | Admit: 2021-05-21 | Discharge: 2021-05-22 | Disposition: A | Discharge status: Home / Self Care | Payer: BC Managed Care – PPO | Attending: Emergency Medicine | Admitting: Emergency Medicine

## 2021-05-21 ENCOUNTER — Encounter (HOSPITAL_COMMUNITY): Payer: Self-pay

## 2021-05-21 ENCOUNTER — Emergency Department (HOSPITAL_COMMUNITY): Payer: BC Managed Care – PPO

## 2021-05-21 DIAGNOSIS — R42 Dizziness and giddiness: Secondary | ICD-10-CM | POA: Insufficient documentation

## 2021-05-21 DIAGNOSIS — R0602 Shortness of breath: Secondary | ICD-10-CM | POA: Diagnosis not present

## 2021-05-21 DIAGNOSIS — R0789 Other chest pain: Secondary | ICD-10-CM | POA: Insufficient documentation

## 2021-05-21 DIAGNOSIS — Z5321 Procedure and treatment not carried out due to patient leaving prior to being seen by health care provider: Secondary | ICD-10-CM | POA: Diagnosis not present

## 2021-05-21 HISTORY — DX: Anemia, unspecified: D64.9

## 2021-05-21 LAB — BASIC METABOLIC PANEL
Anion gap: 6 (ref 5–15)
BUN: 11 mg/dL (ref 6–20)
CO2: 26 mmol/L (ref 22–32)
Calcium: 8.9 mg/dL (ref 8.9–10.3)
Chloride: 104 mmol/L (ref 98–111)
Creatinine, Ser: 0.67 mg/dL (ref 0.44–1.00)
GFR, Estimated: >60 mL/min (ref >60)
Glucose, Bld: 91 mg/dL (ref 70–99)
Potassium: 4.1 mmol/L (ref 3.5–5.1)
Sodium: 136 mmol/L (ref 135–145)

## 2021-05-21 LAB — CBC
HCT: 31.8 % — ABNORMAL LOW (ref 36.0–46.0)
Hemoglobin: 8.3 g/dL — ABNORMAL LOW (ref 12.0–15.0)
MCH: 15.7 pg — ABNORMAL LOW (ref 26.0–34.0)
MCHC: 26.1 g/dL — ABNORMAL LOW (ref 30.0–36.0)
MCV: 60.2 fL — ABNORMAL LOW (ref 80.0–100.0)
Platelets: 458 10*3/uL — ABNORMAL HIGH (ref 150–400)
RBC: 5.28 MIL/uL — ABNORMAL HIGH (ref 3.87–5.11)
RDW: 20.8 % — ABNORMAL HIGH (ref 11.5–15.5)
WBC: 8.9 10*3/uL (ref 4.0–10.5)
nRBC: 0 % (ref 0.0–0.2)

## 2021-05-21 LAB — HCG, QUANTITATIVE, PREGNANCY: hCG, Beta Chain, Quant, S: 1 m[IU]/mL (ref <5)

## 2021-05-21 LAB — TROPONIN I (HIGH SENSITIVITY): Troponin I (High Sensitivity): 2 ng/L (ref <18)

## 2021-05-21 NOTE — ED Triage Notes (Signed)
Patient reports that she had lightheadedness and was seeing colorful spots and pain behind her right eye that day only.  Patient also reports that she had right chest and right shoulder pain that radiated to the left  today. Patient reports that she had SOB when chest pain started. Patient also reports that she has increased chest pain when she breathes in deep.

## 2021-05-21 NOTE — ED Provider Notes (Addendum)
Emergency Medicine Provider Triage Evaluation Note  Sonya Floyd , a 30 y.o. female  was evaluated in triage.  Pt complains of chest pain and dizziness.  Reports that her chest pain has been present over the last 2 to 3 weeks.  Pain started on the right side of her chest however today has moved to the left side of her chest.  Pain was radiating to her right.  Patient describes pain as sharp.  Pain is worse with inhalation.  Endorses associated shortness of breath.  Review of Systems  Positive: Chest pain, dizziness, Negative: Nausea, vomiting, diaphoresis  Physical Exam  BP (!) 135/111 (BP Location: Right Arm)   Pulse 82   Temp 98.7 F (37.1 C) (Oral)   Resp 14   Ht 5\' 2"  (1.575 m)   Wt 81.6 kg   LMP 05/12/2021   SpO2 100%   BMI 32.92 kg/m  Gen:   Awake, no distress   Resp:  Normal effort, lungs clear to auscultation bilaterally MSK:   Moves extremities without difficulty; no swelling or tenderness to bilateral lower extremities Other:    Medical Decision Making  Medically screening exam initiated at 6:02 PM.  Appropriate orders placed.  Jakyria Scorza was informed that the remainder of the evaluation will be completed by another provider, this initial triage assessment does not replace that evaluation, and the importance of remaining in the ED until their evaluation is complete.     07/12/2021, PA-C 05/21/21 1803    07/21/21, PA-C 05/21/21 1803    07/21/21, MD 05/21/21 2312

## 2021-05-22 DIAGNOSIS — R0789 Other chest pain: Secondary | ICD-10-CM | POA: Diagnosis not present

## 2021-05-22 LAB — TROPONIN I (HIGH SENSITIVITY): Troponin I (High Sensitivity): 3 ng/L (ref <18)

## 2021-05-22 NOTE — ED Notes (Signed)
Pt called x2. Currently not present in ED lobby. Did not respond to name call.

## 2021-05-22 NOTE — ED Notes (Signed)
Pt called x3. Currently not present and did not respond to name call. Triage RN notified. 

## 2021-05-22 NOTE — ED Notes (Signed)
Pt called in ED lobby for vitals reassessment- no answer x1. ENMiles 

## 2021-06-07 ENCOUNTER — Other Ambulatory Visit: Payer: Self-pay

## 2021-06-07 ENCOUNTER — Emergency Department (HOSPITAL_BASED_OUTPATIENT_CLINIC_OR_DEPARTMENT_OTHER)
Admission: EM | Admit: 2021-06-07 | Discharge: 2021-06-08 | Disposition: A | Discharge status: Home / Self Care | Payer: BC Managed Care – PPO | Attending: Emergency Medicine | Admitting: Emergency Medicine

## 2021-06-07 ENCOUNTER — Emergency Department (HOSPITAL_BASED_OUTPATIENT_CLINIC_OR_DEPARTMENT_OTHER): Payer: BC Managed Care – PPO

## 2021-06-07 ENCOUNTER — Encounter (HOSPITAL_BASED_OUTPATIENT_CLINIC_OR_DEPARTMENT_OTHER): Payer: Self-pay

## 2021-06-07 DIAGNOSIS — R0602 Shortness of breath: Secondary | ICD-10-CM | POA: Diagnosis present

## 2021-06-07 DIAGNOSIS — D649 Anemia, unspecified: Secondary | ICD-10-CM | POA: Insufficient documentation

## 2021-06-07 DIAGNOSIS — M79602 Pain in left arm: Secondary | ICD-10-CM | POA: Diagnosis not present

## 2021-06-07 DIAGNOSIS — R0789 Other chest pain: Secondary | ICD-10-CM | POA: Diagnosis not present

## 2021-06-07 LAB — CBC
HCT: 29.6 % — ABNORMAL LOW (ref 36.0–46.0)
Hemoglobin: 8.1 g/dL — ABNORMAL LOW (ref 12.0–15.0)
MCH: 16.2 pg — ABNORMAL LOW (ref 26.0–34.0)
MCHC: 27.4 g/dL — ABNORMAL LOW (ref 30.0–36.0)
MCV: 59.1 fL — ABNORMAL LOW (ref 80.0–100.0)
Platelets: 401 10*3/uL — ABNORMAL HIGH (ref 150–400)
RBC: 5.01 MIL/uL (ref 3.87–5.11)
RDW: 21.8 % — ABNORMAL HIGH (ref 11.5–15.5)
WBC: 8.6 10*3/uL (ref 4.0–10.5)
nRBC: 0 % (ref 0.0–0.2)

## 2021-06-07 LAB — BASIC METABOLIC PANEL
Anion gap: 4 — ABNORMAL LOW (ref 5–15)
BUN: 10 mg/dL (ref 6–20)
CO2: 26 mmol/L (ref 22–32)
Calcium: 8.6 mg/dL — ABNORMAL LOW (ref 8.9–10.3)
Chloride: 106 mmol/L (ref 98–111)
Creatinine, Ser: 0.92 mg/dL (ref 0.44–1.00)
GFR, Estimated: >60 mL/min (ref >60)
Glucose, Bld: 115 mg/dL — ABNORMAL HIGH (ref 70–99)
Potassium: 3.6 mmol/L (ref 3.5–5.1)
Sodium: 136 mmol/L (ref 135–145)

## 2021-06-07 LAB — PREGNANCY, URINE: Preg Test, Ur: NEGATIVE

## 2021-06-07 LAB — TROPONIN I (HIGH SENSITIVITY): Troponin I (High Sensitivity): <2 ng/L (ref <18)

## 2021-06-07 NOTE — ED Triage Notes (Signed)
Pt has had intermittent chest pain radiating to left arm x 1 month. Seen at [facility] 2 weeks ago with normal labs. Has had pain in left chest & SOB, lightheaded x 1 week.

## 2021-06-07 NOTE — ED Provider Notes (Signed)
[facility] EMERGENCY DEPARTMENT Provider Note   CSN: [id number] Arrival date & time: 06/07/21  2044     History Chief Complaint  Patient presents with   Chest Pain    Sonya Floyd is a 30 y.o. female.  The history is provided by the patient and medical records.  Chest Pain Sonya Floyd is a 30 y.o. female who presents to the Emergency Department complaining of chest pain.  She presents to the ED for evaluation of intermittent sharp chest pain for the last month.  Pain migrates between right and left chest.  Has associated difficulty breathing.  Also complains of some discomfort to the left arm that started today - waxes and wanes, described as numbness.  When she has the chest pain it is worse when she breathes, lasts about a minute at time.    No fever, cough.  Had swelling to BLE a few weeks ago - now improving.    Has a hx/o anemia, no additional medical problems.  Takes no medications.  No hx/o DVT/PE.  No tobacco.  No recent surgeries.      Past Medical History:  Diagnosis Date   Anemia    High cholesterol     Patient Active Problem List   Diagnosis Date Noted   Encntr for gyn exam (general) (routine) w abnormal findings 09/09/2017   DUB (dysfunctional uterine bleeding) 09/09/2017   Possible exposure to STD 09/09/2017    Past Surgical History:  Procedure Laterality Date   CESAREAN SECTION  11/06/2009   CESAREAN SECTION  07/11/2011   CESAREAN SECTION  01/20/2015   TUBAL LIGATION  01/2017     OB History     Gravida  3   Para  3   Term  3   Preterm      AB      Living  3      SAB      IAB      Ectopic      Multiple      Live Births  3           History reviewed. No pertinent family history.  Social History   Tobacco Use   Smoking status: Never   Smokeless tobacco: Never  Vaping Use   Vaping Use: Never used  Substance Use Topics   Alcohol use: Yes   Drug use: No    Home Medications Prior to Admission  medications   Medication Sig Start Date End Date Taking? Authorizing Provider  docusate sodium (COLACE) 100 MG capsule Take 1 capsule (100 mg total) by mouth every 12 (twelve) hours. 10/28/20   Antony Madura, PA-C  ferrous sulfate 325 (65 FE) MG tablet Take 1 tablet (325 mg total) by mouth 2 (two) times daily with a meal. 10/28/20   Antony Madura, PA-C  naproxen (NAPROSYN) 375 MG tablet Take 1 tablet twice daily for right leg pain. 04/06/21   Molpus, Jonny Ruiz, MD  norgestimate-ethinyl estradiol (SPRINTEC 28) 0.25-35 MG-MCG tablet Take 1 tablet by mouth 3 (three) times daily for 3 days, THEN 1 tablet 2 (two) times daily for 3 days, THEN 1 tablet daily. Take 1 tablet three times a day on day 1-3 Take 1 tablet twice a day on day 4-6 Then take 1 tablet once a day every day thereafter.. 10/28/20 12/03/20  Antony Madura, PA-C    Allergies    Patient has no known allergies.  Review of Systems   Review of Systems  Cardiovascular:  Positive for chest  pain.  All other systems reviewed and are negative.  Physical Exam Updated Vital Signs BP (!) 135/96 (BP Location: Right Arm)   Pulse 63   Temp 98.6 F (37 C) (Oral)   Resp 16   Ht 5\' 2"  (1.575 m)   Wt 81.6 kg   LMP 05/12/2021   SpO2 100%   BMI 32.92 kg/m   Physical Exam Vitals and nursing note reviewed.  Constitutional:      Appearance: She is well-developed.  HENT:     Head: Normocephalic and atraumatic.  Cardiovascular:     Rate and Rhythm: Normal rate and regular rhythm.     Heart sounds: No murmur heard. Pulmonary:     Effort: Pulmonary effort is normal. No respiratory distress.     Breath sounds: Normal breath sounds.  Abdominal:     Palpations: Abdomen is soft.     Tenderness: There is no abdominal tenderness. There is no guarding or rebound.  Musculoskeletal:        General: No swelling or tenderness.     Comments: 2+ radial and DP pulses bilaterally  Skin:    General: Skin is warm and dry.  Neurological:     Mental Status: She is  alert and oriented to person, place, and time.  Psychiatric:        Behavior: Behavior normal.    ED Results / Procedures / Treatments   Labs (all labs ordered are listed, but only abnormal results are displayed) Labs Reviewed  BASIC METABOLIC PANEL - Abnormal; Notable for the following components:      Result Value   Glucose, Bld 115 (*)    Calcium 8.6 (*)    Anion gap 4 (*)    All other components within normal limits  CBC - Abnormal; Notable for the following components:   Hemoglobin 8.1 (*)    HCT 29.6 (*)    MCV 59.1 (*)    MCH 16.2 (*)    MCHC 27.4 (*)    RDW 21.8 (*)    Platelets 401 (*)    All other components within normal limits  D-DIMER, QUANTITATIVE - Abnormal; Notable for the following components:   D-Dimer, Quant 0.61 (*)    All other components within normal limits  PREGNANCY, URINE  TROPONIN I (HIGH SENSITIVITY)  TROPONIN I (HIGH SENSITIVITY)    EKG None  Radiology DG Chest 2 View  Result Date: 06/07/2021 CLINICAL DATA:  Chest pain, shortness of breath EXAM: CHEST - 2 VIEW COMPARISON:  05/21/2021 FINDINGS: The heart size and mediastinal contours are within normal limits. Both lungs are clear. The visualized skeletal structures are unremarkable. IMPRESSION: Normal study. Electronically Signed   By: 07/21/2021 M.D.   On: 06/07/2021 21:10   CT Angio Chest PE W/Cm &/Or Wo Cm  Result Date: 06/08/2021 CLINICAL DATA:  Concern for pulmonary embolism. EXAM: CT ANGIOGRAPHY CHEST WITH CONTRAST TECHNIQUE: Multidetector CT imaging of the chest was performed using the standard protocol during bolus administration of intravenous contrast. Multiplanar CT image reconstructions and MIPs were obtained to evaluate the vascular anatomy. CONTRAST:  65mL OMNIPAQUE IOHEXOL 350 MG/ML SOLN COMPARISON:  Chest radiograph dated 06/07/2021. FINDINGS: Cardiovascular: There is no cardiomegaly or pericardial effusion. The thoracic aorta is unremarkable. The origins of the great vessels  of the aortic arch appear patent. No pulmonary artery embolus identified. Mediastinum/Nodes: No hilar or mediastinal adenopathy. The esophagus and the thyroid gland are grossly unremarkable. No mediastinal fluid collection. Lungs/Pleura: The lungs are clear. There is no  pleural effusion pneumothorax. The central airways are patent. Upper Abdomen: No acute abnormality. Musculoskeletal: No chest wall abnormality. No acute or significant osseous findings. Review of the MIP images confirms the above findings. IMPRESSION: No acute intrathoracic pathology. No CT evidence of pulmonary embolism. Electronically Signed   By: Elgie Collard M.D.   On: 06/08/2021 02:22    Procedures Procedures   Medications Ordered in ED Medications  iohexol (OMNIPAQUE) 350 MG/ML injection 75 mL (75 mLs Intravenous Contrast Given 06/08/21 0157)    ED Course  I have reviewed the triage vital signs and the nursing notes.  Pertinent labs & imaging results that were available during my care of the patient were reviewed by me and considered in my medical decision making (see chart for details).    MDM Rules/Calculators/A&P                          patient here for evaluation of chest pain intermittently for the last month with associated shortness of breath. On evaluation she is non-toxic appearing with no respiratory distress. EKG is without acute ischemic changes in troponin is negative. Presentation is not consistent with ACS. D dimer is mildly elevated and a CTA was obtained, which is negative for PE. Discussed with patient unclear source of symptoms, feel that this is likely musculoskeletal in nature. Discussed home care with NSAIDs, rest, outpatient follow-up and return precautions.  CBC is with anemia, stable when compared to priors. Discussed continuing her iron therapy at home. Discussed importance of PCP follow-up regarding this.  Final Clinical Impression(s) / ED Diagnoses Final diagnoses:  Atypical chest pain   Anemia, unspecified type    Rx / DC Orders ED Discharge Orders     None        Tilden Fossa, MD 06/08/21 [phone]

## 2021-06-08 ENCOUNTER — Emergency Department (HOSPITAL_BASED_OUTPATIENT_CLINIC_OR_DEPARTMENT_OTHER): Payer: BC Managed Care – PPO

## 2021-06-08 DIAGNOSIS — D649 Anemia, unspecified: Secondary | ICD-10-CM | POA: Diagnosis not present

## 2021-06-08 LAB — TROPONIN I (HIGH SENSITIVITY): Troponin I (High Sensitivity): 2 ng/L (ref <18)

## 2021-06-08 LAB — D-DIMER, QUANTITATIVE: D-Dimer, Quant: 0.61 ug/mL-FEU — ABNORMAL HIGH (ref 0.00–0.50)

## 2021-06-08 MED ORDER — IOHEXOL 350 MG/ML SOLN
75.0000 mL | Freq: Once | INTRAVENOUS | Status: AC | PRN
  Administered 2021-06-08: 75 mL via INTRAVENOUS

## 2021-06-08 NOTE — ED Notes (Signed)
Patient verbalizes understanding of discharge instructions. Opportunity for questioning and answers were provided. Armband removed by staff, pt discharged from ED. Ambulated out to lobby  

## 2022-03-08 DIAGNOSIS — N938 Other specified abnormal uterine and vaginal bleeding: Secondary | ICD-10-CM | POA: Diagnosis not present

## 2022-03-08 DIAGNOSIS — I83813 Varicose veins of bilateral lower extremities with pain: Secondary | ICD-10-CM | POA: Insufficient documentation

## 2022-03-08 DIAGNOSIS — D500 Iron deficiency anemia secondary to blood loss (chronic): Secondary | ICD-10-CM | POA: Insufficient documentation

## 2022-03-08 DIAGNOSIS — R030 Elevated blood-pressure reading, without diagnosis of hypertension: Secondary | ICD-10-CM | POA: Diagnosis not present

## 2023-01-10 ENCOUNTER — Encounter (HOSPITAL_BASED_OUTPATIENT_CLINIC_OR_DEPARTMENT_OTHER): Payer: Self-pay | Admitting: Emergency Medicine

## 2023-01-10 ENCOUNTER — Emergency Department (HOSPITAL_BASED_OUTPATIENT_CLINIC_OR_DEPARTMENT_OTHER): Payer: BC Managed Care – PPO

## 2023-01-10 ENCOUNTER — Other Ambulatory Visit: Payer: Self-pay

## 2023-01-10 DIAGNOSIS — N920 Excessive and frequent menstruation with regular cycle: Secondary | ICD-10-CM | POA: Diagnosis not present

## 2023-01-10 DIAGNOSIS — R718 Other abnormality of red blood cells: Secondary | ICD-10-CM | POA: Insufficient documentation

## 2023-01-10 DIAGNOSIS — D500 Iron deficiency anemia secondary to blood loss (chronic): Secondary | ICD-10-CM | POA: Diagnosis not present

## 2023-01-10 DIAGNOSIS — R079 Chest pain, unspecified: Secondary | ICD-10-CM | POA: Diagnosis not present

## 2023-01-10 DIAGNOSIS — R0602 Shortness of breath: Secondary | ICD-10-CM | POA: Diagnosis not present

## 2023-01-10 DIAGNOSIS — E876 Hypokalemia: Secondary | ICD-10-CM | POA: Insufficient documentation

## 2023-01-10 DIAGNOSIS — R110 Nausea: Secondary | ICD-10-CM | POA: Diagnosis not present

## 2023-01-10 DIAGNOSIS — D649 Anemia, unspecified: Secondary | ICD-10-CM | POA: Diagnosis not present

## 2023-01-10 DIAGNOSIS — R0789 Other chest pain: Secondary | ICD-10-CM | POA: Diagnosis not present

## 2023-01-10 NOTE — ED Triage Notes (Signed)
Patient here with a couple day history of chest pain and shortness of breath.  She states that she has some nausea, no vomiting.  She states she had shooting pains in her neck yesterday with the chest pain.

## 2023-01-11 ENCOUNTER — Observation Stay (HOSPITAL_BASED_OUTPATIENT_CLINIC_OR_DEPARTMENT_OTHER)
Admission: EM | Admit: 2023-01-11 | Discharge: 2023-01-11 | Disposition: A | Discharge status: Home / Self Care | Payer: BC Managed Care – PPO | Attending: Internal Medicine | Admitting: Internal Medicine

## 2023-01-11 DIAGNOSIS — D500 Iron deficiency anemia secondary to blood loss (chronic): Secondary | ICD-10-CM | POA: Diagnosis not present

## 2023-01-11 DIAGNOSIS — D649 Anemia, unspecified: Secondary | ICD-10-CM | POA: Diagnosis not present

## 2023-01-11 DIAGNOSIS — E876 Hypokalemia: Secondary | ICD-10-CM

## 2023-01-11 DIAGNOSIS — N920 Excessive and frequent menstruation with regular cycle: Secondary | ICD-10-CM | POA: Diagnosis not present

## 2023-01-11 DIAGNOSIS — R0789 Other chest pain: Secondary | ICD-10-CM | POA: Diagnosis not present

## 2023-01-11 DIAGNOSIS — R718 Other abnormality of red blood cells: Secondary | ICD-10-CM

## 2023-01-11 DIAGNOSIS — R079 Chest pain, unspecified: Secondary | ICD-10-CM

## 2023-01-11 LAB — BASIC METABOLIC PANEL
Anion gap: 8 (ref 5–15)
Anion gap: 9 (ref 5–15)
BUN: 11 mg/dL (ref 6–20)
BUN: 8 mg/dL (ref 6–20)
CO2: 24 mmol/L (ref 22–32)
CO2: 24 mmol/L (ref 22–32)
Calcium: 8.5 mg/dL — ABNORMAL LOW (ref 8.9–10.3)
Calcium: 8.5 mg/dL — ABNORMAL LOW (ref 8.9–10.3)
Chloride: 103 mmol/L (ref 98–111)
Chloride: 106 mmol/L (ref 98–111)
Creatinine, Ser: 0.66 mg/dL (ref 0.44–1.00)
Creatinine, Ser: 0.81 mg/dL (ref 0.44–1.00)
GFR, Estimated: >60 mL/min (ref >60)
GFR, Estimated: >60 mL/min (ref >60)
Glucose, Bld: 103 mg/dL — ABNORMAL HIGH (ref 70–99)
Glucose, Bld: 96 mg/dL (ref 70–99)
Potassium: 3.2 mmol/L — ABNORMAL LOW (ref 3.5–5.1)
Potassium: 3.7 mmol/L (ref 3.5–5.1)
Sodium: 135 mmol/L (ref 135–145)
Sodium: 139 mmol/L (ref 135–145)

## 2023-01-11 LAB — TROPONIN I (HIGH SENSITIVITY)
Troponin I (High Sensitivity): 2 ng/L (ref <18)
Troponin I (High Sensitivity): 2 ng/L (ref <18)

## 2023-01-11 LAB — IRON AND TIBC
Iron: 6 ug/dL — ABNORMAL LOW (ref 28–170)
Iron: 11 ug/dL — ABNORMAL LOW (ref 28–170)
Saturation Ratios: 1 % — ABNORMAL LOW (ref 10.4–31.8)
Saturation Ratios: 2 % — ABNORMAL LOW (ref 10.4–31.8)
TIBC: 455 ug/dL — ABNORMAL HIGH (ref 250–450)
TIBC: 476 ug/dL — ABNORMAL HIGH (ref 250–450)
UIBC: 449 ug/dL
UIBC: 465 ug/dL

## 2023-01-11 LAB — CBC
HCT: 22.3 % — ABNORMAL LOW (ref 36.0–46.0)
HCT: 23.1 % — ABNORMAL LOW (ref 36.0–46.0)
Hemoglobin: 5.3 g/dL — CL (ref 12.0–15.0)
Hemoglobin: 5.6 g/dL — CL (ref 12.0–15.0)
MCH: 13.4 pg — ABNORMAL LOW (ref 26.0–34.0)
MCH: 13.5 pg — ABNORMAL LOW (ref 26.0–34.0)
MCHC: 23.8 g/dL — ABNORMAL LOW (ref 30.0–36.0)
MCHC: 24.2 g/dL — ABNORMAL LOW (ref 30.0–36.0)
MCV: 55.1 fL — ABNORMAL LOW (ref 80.0–100.0)
MCV: 56.7 fL — ABNORMAL LOW (ref 80.0–100.0)
Platelets: 374 K/uL (ref 150–400)
Platelets: 400 10*3/uL (ref 150–400)
RBC: 3.93 MIL/uL (ref 3.87–5.11)
RBC: 4.19 MIL/uL (ref 3.87–5.11)
RDW: 22 % — ABNORMAL HIGH (ref 11.5–15.5)
RDW: 22.8 % — ABNORMAL HIGH (ref 11.5–15.5)
WBC: 6.9 K/uL (ref 4.0–10.5)
WBC: 7.6 10*3/uL (ref 4.0–10.5)
nRBC: 0 % (ref 0.0–0.2)
nRBC: 0 % (ref 0.0–0.2)

## 2023-01-11 LAB — FOLATE: Folate: 10.4 ng/mL (ref >5.9)

## 2023-01-11 LAB — RETICULOCYTES
Immature Retic Fract: 23.5 % — ABNORMAL HIGH (ref 2.3–15.9)
RBC.: 4.23 MIL/uL (ref 3.87–5.11)
Retic Count, Absolute: 44 10*3/uL (ref 19.0–186.0)
Retic Ct Pct: 1 % (ref 0.4–3.1)

## 2023-01-11 LAB — PREPARE RBC (CROSSMATCH)

## 2023-01-11 LAB — TYPE AND SCREEN
ABO/RH(D): A POS
Antibody Screen: NEGATIVE
Unit division: 0

## 2023-01-11 LAB — PHOSPHORUS: Phosphorus: 3.7 mg/dL (ref 2.5–4.6)

## 2023-01-11 LAB — MAGNESIUM: Magnesium: 1.8 mg/dL (ref 1.7–2.4)

## 2023-01-11 LAB — HEMOGLOBIN AND HEMATOCRIT, BLOOD
HCT: 29.6 % — ABNORMAL LOW (ref 36.0–46.0)
Hemoglobin: 8 g/dL — ABNORMAL LOW (ref 12.0–15.0)

## 2023-01-11 LAB — FERRITIN: Ferritin: 1 ng/mL — ABNORMAL LOW (ref 11–307)

## 2023-01-11 LAB — BPAM RBC

## 2023-01-11 LAB — PREGNANCY, URINE: Preg Test, Ur: NEGATIVE

## 2023-01-11 LAB — TSH: TSH: 5.47 u[IU]/mL — ABNORMAL HIGH (ref 0.350–4.500)

## 2023-01-11 LAB — VITAMIN B12: Vitamin B-12: 316 pg/mL (ref 180–914)

## 2023-01-11 MED ORDER — SODIUM CHLORIDE 0.9% IV SOLUTION: Freq: Once | INTRAVENOUS | Status: AC

## 2023-01-11 MED ORDER — SODIUM CHLORIDE 0.9 % IV SOLN
250.0000 mg | Freq: Once | INTRAVENOUS | Status: AC
  Administered 2023-01-11: 250 mg via INTRAVENOUS
  Filled 2023-01-11: qty 20

## 2023-01-11 MED ORDER — POLYETHYLENE GLYCOL 3350 17 G PO PACK: 17.0000 g | PACK | Freq: Every day | ORAL | Status: DC | PRN

## 2023-01-11 MED ORDER — OXYCODONE HCL 5 MG PO TABS: 5.0000 mg | ORAL_TABLET | Freq: Four times a day (QID) | ORAL | Status: DC | PRN

## 2023-01-11 MED ORDER — LACTATED RINGERS IV SOLN: INTRAVENOUS | Status: DC

## 2023-01-11 MED ORDER — POTASSIUM CHLORIDE CRYS ER 20 MEQ PO TBCR
40.0000 meq | EXTENDED_RELEASE_TABLET | Freq: Once | ORAL | Status: AC
  Administered 2023-01-11: 40 meq via ORAL
  Filled 2023-01-11: qty 2

## 2023-01-11 MED ORDER — PROCHLORPERAZINE EDISYLATE 10 MG/2ML IJ SOLN: 5.0000 mg | Freq: Four times a day (QID) | INTRAMUSCULAR | Status: DC | PRN

## 2023-01-11 MED ORDER — ACETAMINOPHEN 325 MG PO TABS: 650.0000 mg | ORAL_TABLET | Freq: Four times a day (QID) | ORAL | Status: DC | PRN

## 2023-01-11 MED ORDER — MELATONIN 5 MG PO TABS: 5.0000 mg | ORAL_TABLET | Freq: Every evening | ORAL | Status: DC | PRN

## 2023-01-11 MED ORDER — FERROUS SULFATE 325 (65 FE) MG PO TABS: 325.0000 mg | ORAL_TABLET | Freq: Two times a day (BID) | ORAL | 1 refills | Status: AC

## 2023-01-11 NOTE — Discharge Summary (Signed)
Physician Discharge Summary  Sonya Floyd ZOX:[id number] DOB: 08-Jan-1991 DOA: 01/11/2023  PCP: Patient, No Pcp Per  Admit date: 01/11/2023 Discharge date: 01/11/2023  Admitted From: home Discharge disposition: home   Recommendations for Outpatient Follow-Up:   CBC 1 week Outpatient GYN follow for treatment of heave menses   Discharge Diagnosis:   Principal Problem:   Symptomatic anemia    Discharge Condition: Improved.  Diet recommendation:  Regular.  Wound care: None.  Code status: Full.   History of Present Illness:   Sonya Floyd is a 32 y.o. female with medical history significant for dysfunctional uterine bleeding, iron deficiency anemia due to chronic blood loss, anemia of chronic blood loss from heavy menses who initially presented to [facility] ED from home with complaints of left-sided chest pain, moderate in severity, sharp in nature and radiating to her neck and left arm.  Also endorses exertional dyspnea for the past 3 days.   In the ED, lab studies notable for hemoglobin of 5.6K.  The patient is currently on her menses, which started on 12/27/2022 and are ongoing.  Her menses are currently spotty and slowing down.  States she followed with gynecology in the past, and had her fallopian tubes tied.   EDP discussed the case with [facility], Dr. Joneen Roach.  The patient was accepted to [facility] progressive care unit as observation status.   At the time of this visit, the patient has no new complaints.  She consents to receiving 2 units of PRBCs blood transfusion.  Her first blood transfusion was 2 years ago when she had a hemoglobin of 6.1K.   Hospital Course by Problem:   Symptomatic anemia in the setting of chronic blood loss from heavy menses, menorrhagia History of dysfunctional uterine bleeding Presented with hemoglobin of 5.6K Ongoing menses, started on 12/27/2022- stopped yesterday The patient states her fallopian tubes are tied and has followed  with gynecology in the past 2 units PRBCs were ordered to be transfused with good results   Atypical chest pain likely secondary to severe anemia High-sensitivity troponin negative No evidence of acute ischemia on twelve-lead EKG No report of anginal symptoms at this time. Monitor on telemetry.   Iron deficiency anemia due to chronic blood loss Follow iron studies IV Feraheme x1 and then compliance with PO   hypokalemia -replete     Medical Consultants:      Discharge Exam:   Vitals:   01/11/23 1300 01/11/23 1654  BP: (!) 141/87 131/88  Pulse: 68 70  Resp: 18 20  Temp: 98.4 F (36.9 C) 98.9 F (37.2 C)  SpO2: 100% 100%   Vitals:   01/11/23 1243 01/11/23 1244 01/11/23 1300 01/11/23 1654  BP: 126/89 126/89 (!) 141/87 131/88  Pulse: 71 71 68 70  Resp: 17 17 18 20   Temp: 98.3 F (36.8 C) 98.3 F (36.8 C) 98.4 F (36.9 C) 98.9 F (37.2 C)  TempSrc: Oral Oral Oral Oral  SpO2: 100% 100% 100% 100%  Weight:      Height:        General exam: Appears calm and comfortable  The results of significant diagnostics from this hospitalization (including imaging, microbiology, ancillary and laboratory) are listed below for reference.     Procedures and Diagnostic Studies:   No results found.   Labs:   Basic Metabolic Panel: Recent Labs  Lab 01/10/23 2344 01/11/23 0506  NA 135 139  K 3.2* 3.7  CL 103 106  CO2 24 24  GLUCOSE 103* 96  BUN 8 11  CREATININE 0.81 0.66  CALCIUM 8.5* 8.5*  MG  --  1.8  PHOS  --  3.7   GFR Estimated Creatinine Clearance: 96.2 mL/min (by C-G formula based on SCr of 0.66 mg/dL). Liver Function Tests: No results for input(s): "AST", "ALT", "ALKPHOS", "BILITOT", "PROT", "ALBUMIN" in the last 168 hours. No results for input(s): "LIPASE", "AMYLASE" in the last 168 hours. No results for input(s): "AMMONIA" in the last 168 hours. Coagulation profile No results for input(s): "INR", "PROTIME" in the last 168 hours.  CBC: Recent  Labs  Lab 01/10/23 2344 01/11/23 0506 01/11/23 1822  WBC 7.6 6.9  --   HGB 5.6* 5.3* 8.0*  HCT 23.1* 22.3* 29.6*  MCV 55.1* 56.7*  --   PLT 400 374  --    Cardiac Enzymes: No results for input(s): "CKTOTAL", "CKMB", "CKMBINDEX", "TROPONINI" in the last 168 hours. BNP: Invalid input(s): "POCBNP" CBG: No results for input(s): "GLUCAP" in the last 168 hours. D-Dimer No results for input(s): "DDIMER" in the last 72 hours. Hgb A1c No results for input(s): "HGBA1C" in the last 72 hours. Lipid Profile No results for input(s): "CHOL", "HDL", "LDLCALC", "TRIG", "CHOLHDL", "LDLDIRECT" in the last 72 hours. Thyroid function studies Recent Labs    01/11/23 0555  TSH 5.470*   Anemia work up Recent Labs    01/10/23 2344 01/11/23 0216 01/11/23 0613  VITAMINB12  --  316  --   FOLATE  --  10.4  --   FERRITIN  --  1*  --   TIBC  --  455* 476*  IRON  --  6* 11*  RETICCTPCT 1.0  --   --    Microbiology No results found for this or any previous visit (from the past 240 hour(s)).   Discharge Instructions:   Discharge Instructions     Diet general   Complete by: As directed    Discharge instructions   Complete by: As directed    Need to follow up with GYN for definitive treatment of heavy menses and anemia Be sure to continue iron-- use mirlax BID if constipating   Increase activity slowly   Complete by: As directed       Allergies as of 01/11/2023   No Known Allergies      Medication List     STOP taking these medications    naproxen 375 MG tablet Commonly known as: NAPROSYN   norgestimate-ethinyl estradiol 0.25-35 MG-MCG tablet Commonly known as: Sprintec 28       TAKE these medications    ferrous sulfate 325 (65 FE) MG tablet Take 1 tablet (325 mg total) by mouth 2 (two) times daily with a meal.   multivitamin tablet Take 1 tablet by mouth daily.          Time coordinating discharge: 45 min  Signed:  Joseph Art DO  [facility] 01/12/2023, 12:33 PM

## 2023-01-11 NOTE — ED Provider Notes (Signed)
[facility] Provider Note: Lowella Dell, MD, FACEP  CSN: [id number] MRN: [id number] ARRIVAL: 01/10/23 at 2331 ROOM: MH09/MH09   CHIEF COMPLAINT  Chest Pain   HISTORY OF PRESENT ILLNESS  01/11/23 1:47 AM Sonya Floyd is a 32 y.o. female with a history of anemia which she believes is due to heavy periods.  She is here with several days of chest pain and shortness of breath.  The chest pain is intermittent.  It is sharp and on the left side of her chest.  It is worse with deep breathing and with exertion.  Yesterday shot into her neck and left arm.  She is not having pain at the present time but when she does have that she rates it as a 5 out of 10.  She has had some nausea with this but no vomiting.     Past Medical History:  Diagnosis Date   Anemia    High cholesterol     Past Surgical History:  Procedure Laterality Date   CESAREAN SECTION  11/06/2009   CESAREAN SECTION  07/11/2011   CESAREAN SECTION  01/20/2015   TUBAL LIGATION  01/2017    History reviewed. No pertinent family history.  Social History   Tobacco Use   Smoking status: Never   Smokeless tobacco: Never  Vaping Use   Vaping Use: Never used  Substance Use Topics   Alcohol use: Yes   Drug use: No    Prior to Admission medications   Medication Sig Start Date End Date Taking? Authorizing Provider  docusate sodium (COLACE) 100 MG capsule Take 1 capsule (100 mg total) by mouth every 12 (twelve) hours. 10/28/20   Antony Madura, PA-C  ferrous sulfate 325 (65 FE) MG tablet Take 1 tablet (325 mg total) by mouth 2 (two) times daily with a meal. 10/28/20   Antony Madura, PA-C  naproxen (NAPROSYN) 375 MG tablet Take 1 tablet twice daily for right leg pain. 04/06/21   Raphel Stickles, Jonny Ruiz, MD  norgestimate-ethinyl estradiol (SPRINTEC 28) 0.25-35 MG-MCG tablet Take 1 tablet by mouth 3 (three) times daily for 3 days, THEN 1 tablet 2 (two) times daily for 3 days, THEN 1 tablet daily. Take 1 tablet three times a day  on day 1-3 Take 1 tablet twice a day on day 4-6 Then take 1 tablet once a day every day thereafter.. 10/28/20 12/03/20  Antony Madura, PA-C    Allergies Patient has no known allergies.   REVIEW OF SYSTEMS  Negative except as noted here or in the History of Present Illness.   PHYSICAL EXAMINATION  Initial Vital Signs Blood pressure 128/87, pulse 64, temperature 98 F (36.7 C), temperature source Oral, resp. rate 20, last menstrual period 12/27/2022, SpO2 100 %.  Examination General: Well-developed, well-nourished female in no acute distress; appearance consistent with age of record HENT: normocephalic; atraumatic Eyes: pupils equal, round and reactive to light; extraocular muscles intact; pale conjunctivae Neck: supple Heart: regular rate and rhythm Lungs: clear to auscultation bilaterally Abdomen: soft; nondistended; nontender; bowel sounds present Extremities: No deformity; full range of motion; pulses normal Neurologic: Awake, alert and oriented; motor function intact in all extremities and symmetric; no facial droop Skin: Warm and dry Psychiatric: Normal mood and affect   RESULTS  Summary of this visit's results, reviewed and interpreted by myself:   EKG Interpretation  Date/Time:  Friday Jan 10 2023 23:42:19 EDT Ventricular Rate:  69 PR Interval:  147 QRS Duration: 91 QT Interval:  393 QTC Calculation: 421 R  Axis:   36 Text Interpretation: Sinus rhythm No significant change was found Confirmed by Leone Mobley, Jonny Ruiz ([id number]) on 01/11/2023 12:00:15 AM       Laboratory Studies: Results for orders placed or performed during the hospital encounter of 01/11/23 (from the past 24 hour(s))  Basic metabolic panel     Status: Abnormal   Collection Time: 01/10/23 11:44 PM  Result Value Ref Range   Sodium 135 135 - 145 mmol/L   Potassium 3.2 (L) 3.5 - 5.1 mmol/L   Chloride 103 98 - 111 mmol/L   CO2 24 22 - 32 mmol/L   Glucose, Bld 103 (H) 70 - 99 mg/dL   BUN 8 6 - 20 mg/dL    Creatinine, Ser 6.04 0.44 - 1.00 mg/dL   Calcium 8.5 (L) 8.9 - 10.3 mg/dL   GFR, Estimated >54 >09 mL/min   Anion gap 8 5 - 15  CBC     Status: Abnormal   Collection Time: 01/10/23 11:44 PM  Result Value Ref Range   WBC 7.6 4.0 - 10.5 K/uL   RBC 4.19 3.87 - 5.11 MIL/uL   Hemoglobin 5.6 (LL) 12.0 - 15.0 g/dL   HCT 81.1 (L) 91.4 - 78.2 %   MCV 55.1 (L) 80.0 - 100.0 fL   MCH 13.4 (L) 26.0 - 34.0 pg   MCHC 24.2 (L) 30.0 - 36.0 g/dL   RDW 95.6 (H) 21.3 - 08.6 %   Platelets 400 150 - 400 K/uL   nRBC 0.0 0.0 - 0.2 %  Troponin I (High Sensitivity)     Status: None   Collection Time: 01/10/23 11:44 PM  Result Value Ref Range   Troponin I (High Sensitivity) 2 <18 ng/L  Pregnancy, urine     Status: None   Collection Time: 01/11/23 12:15 AM  Result Value Ref Range   Preg Test, Ur NEGATIVE NEGATIVE   Imaging Studies: DG Chest 2 View  Result Date: 01/10/2023 CLINICAL DATA:  Chest pain for 3 days. Lightheadedness. Shortness of breath and nausea. Nonsmoker. EXAM: CHEST - 2 VIEW COMPARISON:  06/07/2021 FINDINGS: Normal heart size and pulmonary vascularity. No focal airspace disease or consolidation in the lungs. No blunting of costophrenic angles. No pneumothorax. Mediastinal contours appear intact. Calcified granuloma in the right upper lung. IMPRESSION: No active cardiopulmonary disease. Electronically Signed   By: Burman Nieves M.D.   On: 01/10/2023 23:59    ED COURSE and MDM  Nursing notes, initial and subsequent vitals signs, including pulse oximetry, reviewed and interpreted by myself.  Vitals:   01/10/23 2336  BP: 128/87  Pulse: 64  Resp: 20  Temp: 98 F (36.7 C)  TempSrc: Oral  SpO2: 100%   Medications  potassium chloride SA (KLOR-CON M) CR tablet 40 mEq (40 mEq Oral Given 01/11/23 0214)    I suspect the patient's chest pain and shortness of breath, worsened by exertion, are manifestations of her significant anemia.  Her troponin is normal and her EKG is nonischemic but this  could represent an anginal equivalent due to a hemoglobin of 5.6.  She has a history of anemia and her hemoglobin went as low as 6.1 two years ago but she usually has a hemoglobin in the range of 7-8.  We will have her admitted for transfusion and further evaluation of her anemia.  2:46 AM Dr. Joneen Roach to admit to the hospitalist service.  PROCEDURES  Procedures   ED DIAGNOSES     ICD-10-CM   1. Symptomatic anemia  D64.9  2. Microcytosis  R71.8     3. Nonspecific chest pain  R07.9     4. Hypokalemia  E87.6          Devinne Epstein, Jonny Ruiz, MD 01/11/23 [phone]

## 2023-01-11 NOTE — Progress Notes (Signed)
Patient received discharge orders to go home. Patient was given discharge paperwork/instructions. RN went over discharge instructions/paperwork with the patient. All questions/concerns were addressed and answered. The patient left stable, had discharge paperwork, and had all personal belongings.

## 2023-01-11 NOTE — Progress Notes (Signed)
Patient admitted after midnight, please see H&P.  Here with low hgb from presumed heavy menses.  Has seen Gyn in the past and placed on birth control pills for this but did not take them.  Is supposed to be on PO iron and tries to take everyday.  Will give 2 units PRBC. Discussed starting medications for heavy menses but patient prefers to follow up with GYN next week.    Hope for d/c after transfusion.  Marlin Canary DO

## 2023-01-11 NOTE — ED Notes (Signed)
[facility] has been called for transport @02 :58 am

## 2023-01-11 NOTE — H&P (Addendum)
History and Physical  Sonya Floyd ZOX:[id number] DOB: 04-09-1991 DOA: 01/11/2023  Referring physician: Accepted by Dr. Joneen Roach [address] [facility], hospitalist service. PCP: Patient, No Pcp Per  Outpatient Specialists: Gynecology Patient coming from: Home through [facility] ED  Chief Complaint: Heavy menses and chest pain.  HPI: Sonya Floyd is a 32 y.o. female with medical history significant for dysfunctional uterine bleeding, iron deficiency anemia due to chronic blood loss, anemia of chronic blood loss from heavy menses who initially presented to [facility] ED from home with complaints of left-sided chest pain, moderate in severity, sharp in nature and radiating to her neck and left arm.  Also endorses exertional dyspnea for the past 3 days.  In the ED, lab studies notable for hemoglobin of 5.6K.  The patient is currently on her menses, which started on 12/27/2022 and are ongoing.  Her menses are currently spotty and slowing down.  States she followed with gynecology in the past, and had her fallopian tubes tied.  EDP discussed the case with [facility], Dr. Joneen Roach.  The patient was accepted to [facility] progressive care unit as observation status.  At the time of this visit, the patient has no new complaints.  She consents to receiving 2 units of PRBCs blood transfusion.  Her first blood transfusion was 2 years ago when she had a hemoglobin of 6.1K.   ED Course: Temperature 98.2.  BP 131/84, pulse 57, respiratory 16, saturation 100% on room air.  Lab studies markable for hemoglobin 5.6, MCV 55.1, WBC 7.6, platelet 400.  Serum potassium 3.2.  Review of Systems: Review of systems as noted in the HPI. All other systems reviewed and are negative.   Past Medical History:  Diagnosis Date   Anemia    High cholesterol    Past Surgical History:  Procedure Laterality Date   CESAREAN SECTION  11/06/2009   CESAREAN SECTION  07/11/2011   CESAREAN SECTION  01/20/2015   TUBAL LIGATION  01/2017    Social  History:  reports that she has never smoked. She has never used smokeless tobacco. She reports current alcohol use. She reports that she does not use drugs.   No Known Allergies  Family history:  None reported.  Prior to Admission medications   Medication Sig Start Date End Date Taking? Authorizing Provider  docusate sodium (COLACE) 100 MG capsule Take 1 capsule (100 mg total) by mouth every 12 (twelve) hours. 10/28/20   Antony Madura, PA-C  ferrous sulfate 325 (65 FE) MG tablet Take 1 tablet (325 mg total) by mouth 2 (two) times daily with a meal. 10/28/20   Antony Madura, PA-C  naproxen (NAPROSYN) 375 MG tablet Take 1 tablet twice daily for right leg pain. 04/06/21   Molpus, Jonny Ruiz, MD  norgestimate-ethinyl estradiol (SPRINTEC 28) 0.25-35 MG-MCG tablet Take 1 tablet by mouth 3 (three) times daily for 3 days, THEN 1 tablet 2 (two) times daily for 3 days, THEN 1 tablet daily. Take 1 tablet three times a day on day 1-3 Take 1 tablet twice a day on day 4-6 Then take 1 tablet once a day every day thereafter.. 10/28/20 12/03/20  Antony Madura, PA-C    Physical Exam: BP 131/84   Pulse (!) 57   Temp 98.2 F (36.8 C) (Oral)   Resp 16   LMP 12/27/2022 (Exact Date)   SpO2 100%   General: 32 y.o. year-old female well developed well nourished in no acute distress.  Alert and oriented x3. Cardiovascular: Regular rate and rhythm with no rubs or  gallops.  No thyromegaly or JVD noted.  No lower extremity edema. 2/4 pulses in all 4 extremities. Respiratory: Clear to auscultation with no wheezes or rales. Good inspiratory effort. Abdomen: Soft nontender nondistended with normal bowel sounds x4 quadrants. Muskuloskeletal: No cyanosis, clubbing or edema noted bilaterally Neuro: CN II-XII intact, strength, sensation, reflexes Skin: No ulcerative lesions noted or rashes Psychiatry: Judgement and insight appear normal. Mood is appropriate for condition and setting          Labs on Admission:  Basic Metabolic  Panel: Recent Labs  Lab 01/10/23 2344  NA 135  K 3.2*  CL 103  CO2 24  GLUCOSE 103*  BUN 8  CREATININE 0.81  CALCIUM 8.5*   Liver Function Tests: No results for input(s): "AST", "ALT", "ALKPHOS", "BILITOT", "PROT", "ALBUMIN" in the last 168 hours. No results for input(s): "LIPASE", "AMYLASE" in the last 168 hours. No results for input(s): "AMMONIA" in the last 168 hours. CBC: Recent Labs  Lab 01/10/23 2344  WBC 7.6  HGB 5.6*  HCT 23.1*  MCV 55.1*  PLT 400   Cardiac Enzymes: No results for input(s): "CKTOTAL", "CKMB", "CKMBINDEX", "TROPONINI" in the last 168 hours.  BNP (last 3 results) No results for input(s): "BNP" in the last 8760 hours.  ProBNP (last 3 results) No results for input(s): "PROBNP" in the last 8760 hours.  CBG: No results for input(s): "GLUCAP" in the last 168 hours.  Radiological Exams on Admission: DG Chest 2 View  Result Date: 01/10/2023 CLINICAL DATA:  Chest pain for 3 days. Lightheadedness. Shortness of breath and nausea. Nonsmoker. EXAM: CHEST - 2 VIEW COMPARISON:  06/07/2021 FINDINGS: Normal heart size and pulmonary vascularity. No focal airspace disease or consolidation in the lungs. No blunting of costophrenic angles. No pneumothorax. Mediastinal contours appear intact. Calcified granuloma in the right upper lung. IMPRESSION: No active cardiopulmonary disease. Electronically Signed   By: Burman Nieves M.D.   On: 01/10/2023 23:59    EKG: I independently viewed the EKG done and my findings are as followed: Sinus rhythm rate of 69.  Nonspecific ST-T changes.  QTc 421.  Assessment/Plan Present on Admission:  Symptomatic anemia  Principal Problem:   Symptomatic anemia  Symptomatic anemia in the setting of chronic blood loss from heavy menses, menorrhagia History of dysfunctional uterine bleeding Presented with hemoglobin of 5.6K Ongoing menses, started on 12/27/2022 The patient states her fallopian tubes are tied and has followed with  gynecology in the past 2 units PRBCs were ordered to be transfused Repeat CBC post blood transfusion Follow iron studies, IV Feraheme 510 mg x 1 if severely iron deficient.  Atypical chest pain likely secondary to severe anemia High-sensitivity troponin negative No evidence of acute ischemia on twelve-lead EKG No report of anginal symptoms at this time. Monitor on telemetry.  Iron deficiency anemia due to chronic blood loss Follow iron studies IV Feraheme if severely deficient  Hypokalemia Serum potassium 5.2 Repleted orally Repeat BMP and check magnesium level  Sinus bradycardia Obtain TSH   DVT prophylaxis: SCDs  Code Status: Full code  Family Communication: None at bedside.  Disposition Plan: Admitted to progressive care unit  Consults called: None.  Admission status: Observation status.   Status is: Observation    Darlin Drop MD [facility] Pager [phone]  If 7PM-7AM, please contact night-coverage [email or web address] Password [email or web address]  01/11/2023, 4:55 AM

## 2023-01-11 NOTE — Progress Notes (Signed)
This is a 32 year old female who has a history of recurrent anemia secondary to menorrhagia and HLD.  She presents with several days of left-sided chest pains.  It is worse with inspiration and exertion.  She additionally becomes short of breath with exertion.    She came to [facility] ER.  Patient's hemoglobin was 5.6.  MCV 55.  Overnight observation requested for transfusion

## 2023-01-11 NOTE — Plan of Care (Signed)

## 2023-01-12 LAB — T4, FREE: Free T4: 0.9 ng/dL (ref 0.61–1.12)

## 2023-01-13 LAB — TYPE AND SCREEN: Unit division: 0

## 2023-01-13 LAB — BPAM RBC
Blood Product Expiration Date: 202406222359
Blood Product Expiration Date: 202406222359
ISSUE DATE / TIME: 202406010840
ISSUE DATE / TIME: 202406011239
Unit Type and Rh: 6200
Unit Type and Rh: 6200

## 2023-01-14 LAB — MISC LABCORP TEST (SEND OUT): Labcorp test code: 83935

## 2023-03-19 IMAGING — CT CT ANGIO CHEST
2 of 8 series · 19 of 36 positions shown · IV contrast (Omnipaque)
Comparison: Chest radiograph dated 06/07/2021.

CLINICAL DATA: Concern for pulmonary embolism.

EXAM:
CT ANGIOGRAPHY CHEST WITH CONTRAST
TECHNIQUE: Multidetector CT imaging of the chest was performed using the
standard protocol during bolus administration of intravenous
contrast. Multiplanar CT image reconstructions and MIPs were
obtained to evaluate the vascular anatomy.
CONTRAST:  75mL OMNIPAQUE IOHEXOL 350 MG/ML SOLN

[Series 6: pe thins · axial · 0.73mm/px · z∈[+1115,+1349]mm · 18 of 262 slices shown]
[im 14/262  lung]
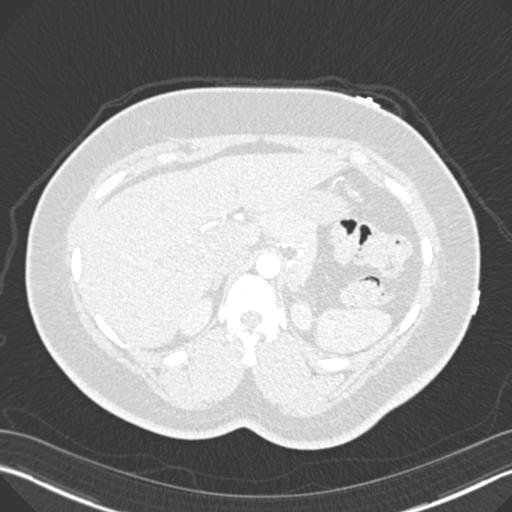
[im 28/262  mediastinal]
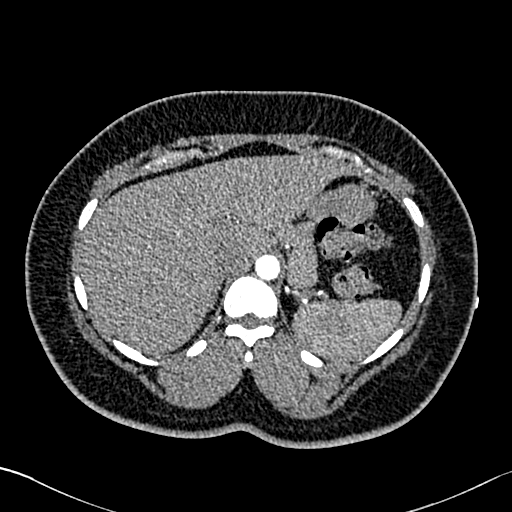
[im 42/262  lung]
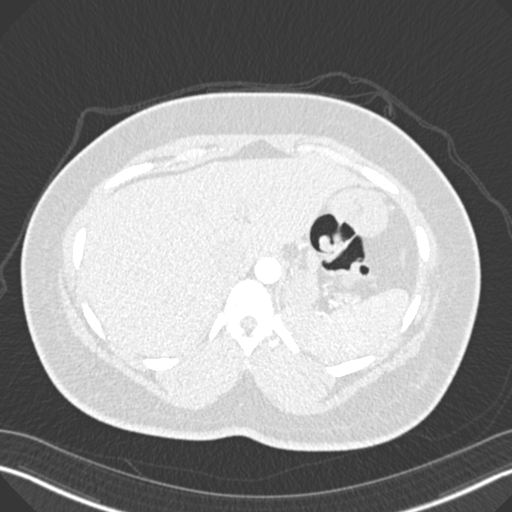
[im 55/262  mediastinal]
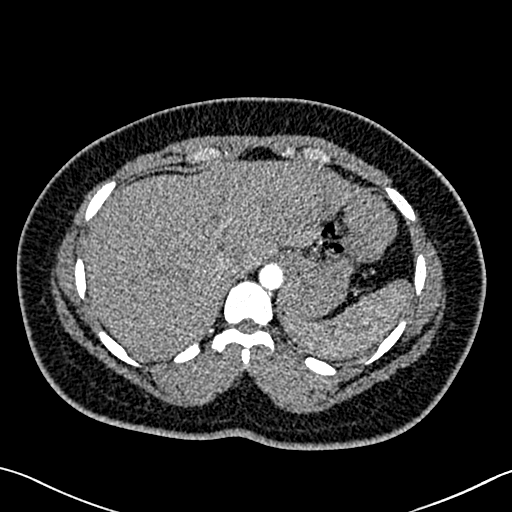
[im 69/262  lung]
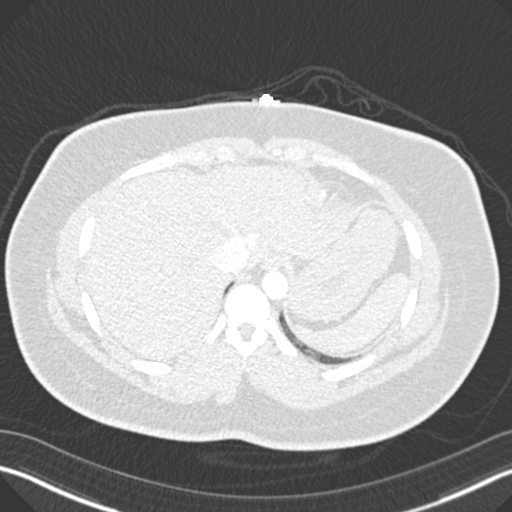
[im 83/262  mediastinal]
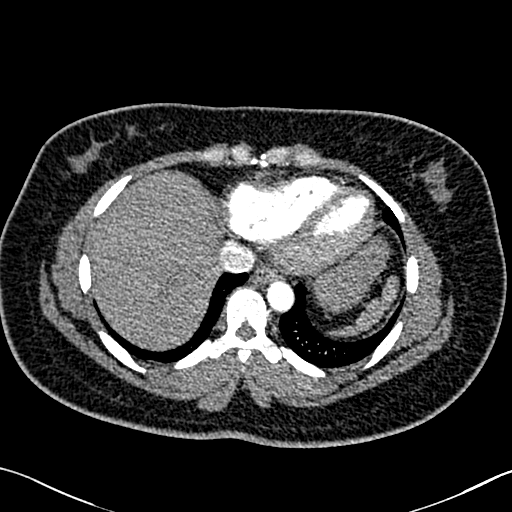
[im 97/262  lung]
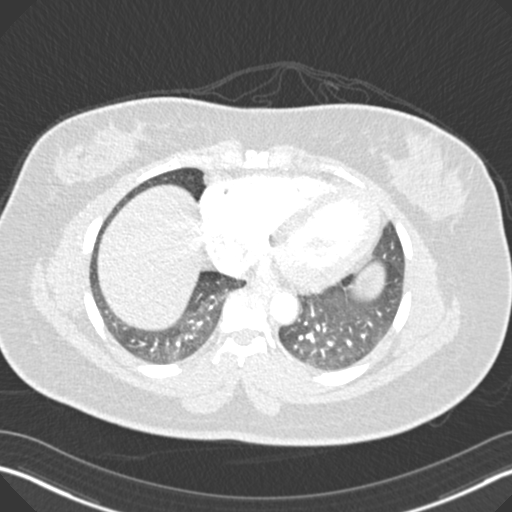
[im 110/262  mediastinal]
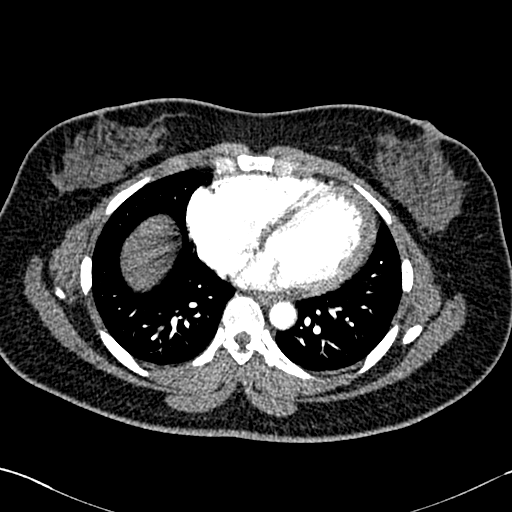
[im 124/262  lung]
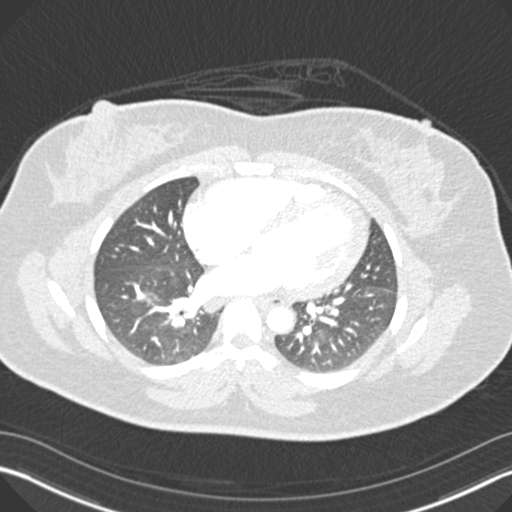
[im 138/262  mediastinal]
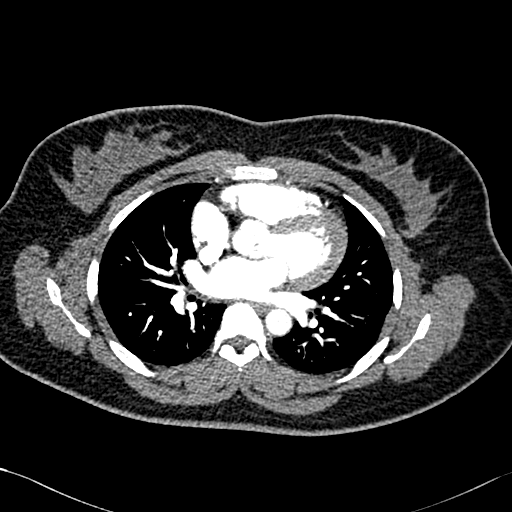
[im 152/262  lung]
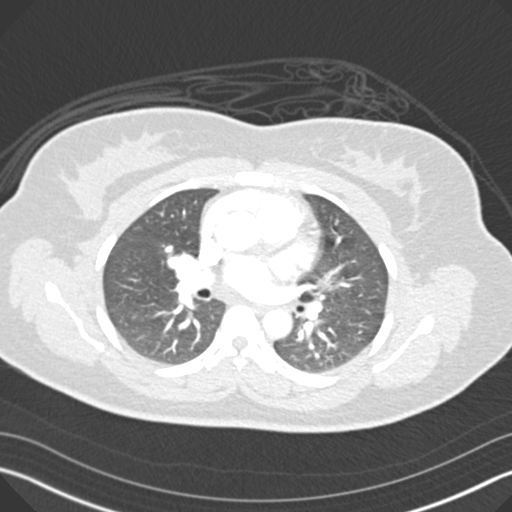
[im 165/262  mediastinal]
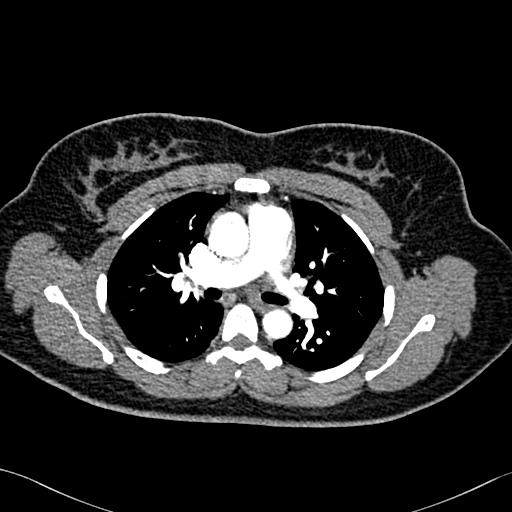
[im 179/262  lung]
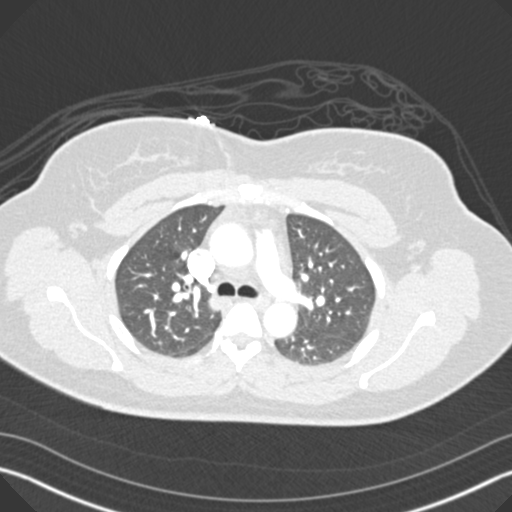
[im 193/262  mediastinal]
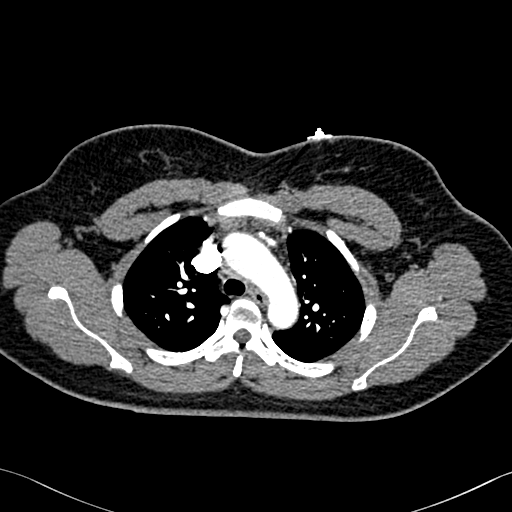
[im 207/262  lung]
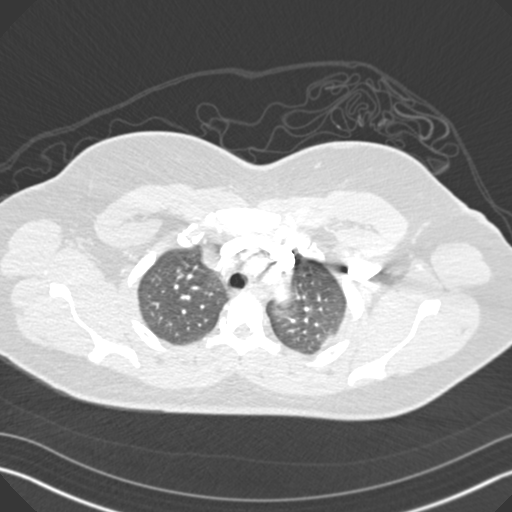
[im 220/262  mediastinal]
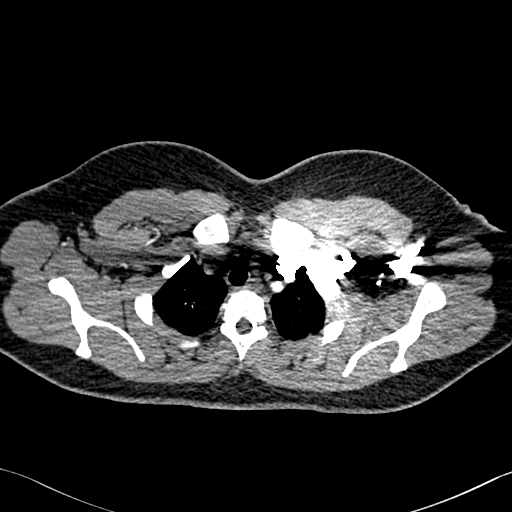
[im 234/262  lung]
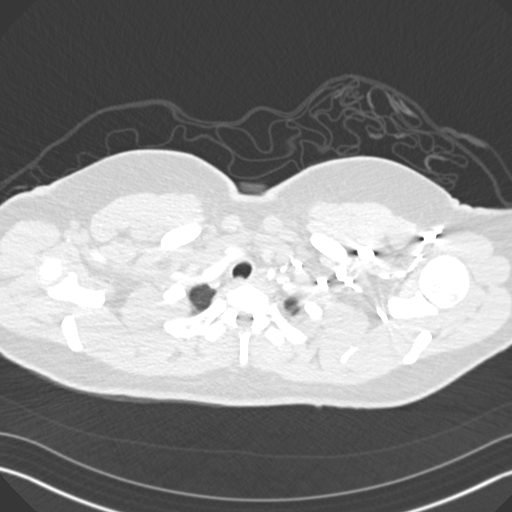
[im 248/262  mediastinal]
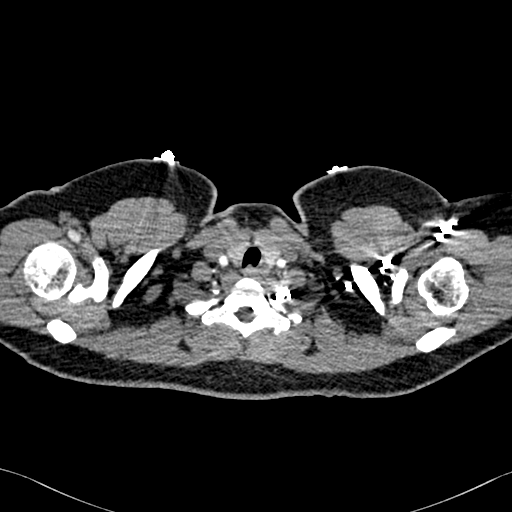

[Series 7: pe coronal mpr · coronal · 0.52mm/px · 1 of 122 slices shown]
[im 61/122  mediastinal]
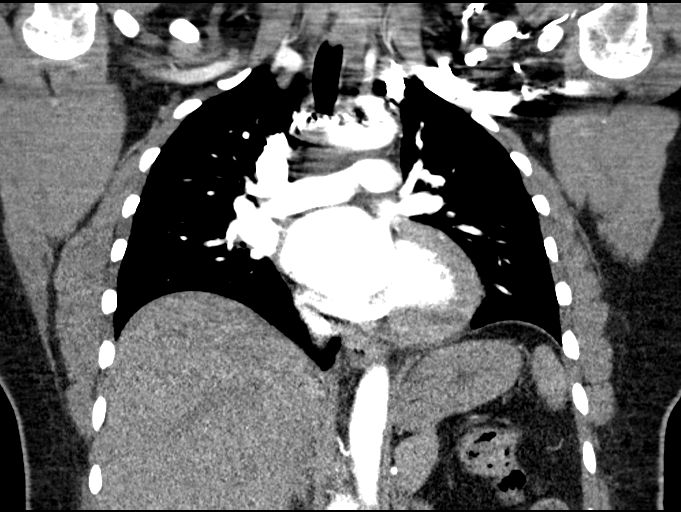

[19 of 36 positions shown; findings below may reference images not displayed]

FINDINGS: Cardiovascular: There is no cardiomegaly or pericardial effusion.
The thoracic aorta is unremarkable. The origins of the great vessels
of the aortic arch appear patent. No pulmonary artery embolus
identified.

Mediastinum/Nodes: No hilar or mediastinal adenopathy. The esophagus
and the thyroid gland are grossly unremarkable. No mediastinal fluid
collection.

Lungs/Pleura: The lungs are clear. There is no pleural effusion
pneumothorax. The central airways are patent.

Upper Abdomen: No acute abnormality.

Musculoskeletal: No chest wall abnormality. No acute or significant
osseous findings.

Review of the MIP images confirms the above findings.
IMPRESSION: No acute intrathoracic pathology. No CT evidence of pulmonary
embolism.

## 2023-07-03 DIAGNOSIS — N921 Excessive and frequent menstruation with irregular cycle: Secondary | ICD-10-CM | POA: Diagnosis not present

## 2023-07-03 DIAGNOSIS — Z113 Encounter for screening for infections with a predominantly sexual mode of transmission: Secondary | ICD-10-CM | POA: Diagnosis not present

## 2023-07-03 DIAGNOSIS — Z1151 Encounter for screening for human papillomavirus (HPV): Secondary | ICD-10-CM | POA: Diagnosis not present

## 2023-07-03 DIAGNOSIS — Z01419 Encounter for gynecological examination (general) (routine) without abnormal findings: Secondary | ICD-10-CM | POA: Diagnosis not present

## 2023-07-03 DIAGNOSIS — N898 Other specified noninflammatory disorders of vagina: Secondary | ICD-10-CM | POA: Diagnosis not present

## 2023-07-03 DIAGNOSIS — D649 Anemia, unspecified: Secondary | ICD-10-CM | POA: Diagnosis not present

## 2023-07-03 DIAGNOSIS — Z124 Encounter for screening for malignant neoplasm of cervix: Secondary | ICD-10-CM | POA: Diagnosis not present

## 2023-07-03 DIAGNOSIS — R030 Elevated blood-pressure reading, without diagnosis of hypertension: Secondary | ICD-10-CM | POA: Diagnosis not present

## 2023-07-22 DIAGNOSIS — R030 Elevated blood-pressure reading, without diagnosis of hypertension: Secondary | ICD-10-CM | POA: Diagnosis not present

## 2023-07-22 DIAGNOSIS — N921 Excessive and frequent menstruation with irregular cycle: Secondary | ICD-10-CM | POA: Diagnosis not present

## 2023-07-22 DIAGNOSIS — D649 Anemia, unspecified: Secondary | ICD-10-CM | POA: Diagnosis not present

## 2023-07-22 DIAGNOSIS — Z13220 Encounter for screening for lipoid disorders: Secondary | ICD-10-CM | POA: Diagnosis not present

## 2023-07-22 DIAGNOSIS — Z131 Encounter for screening for diabetes mellitus: Secondary | ICD-10-CM | POA: Diagnosis not present

## 2023-07-22 DIAGNOSIS — Z1329 Encounter for screening for other suspected endocrine disorder: Secondary | ICD-10-CM | POA: Diagnosis not present

## 2023-07-22 DIAGNOSIS — I10 Essential (primary) hypertension: Secondary | ICD-10-CM | POA: Diagnosis not present

## 2023-08-26 DIAGNOSIS — Z113 Encounter for screening for infections with a predominantly sexual mode of transmission: Secondary | ICD-10-CM | POA: Diagnosis not present

## 2023-08-26 DIAGNOSIS — Z319 Encounter for procreative management, unspecified: Secondary | ICD-10-CM | POA: Diagnosis not present

## 2023-08-27 ENCOUNTER — Other Ambulatory Visit: Payer: Self-pay

## 2023-08-27 ENCOUNTER — Emergency Department (HOSPITAL_COMMUNITY)
Admission: EM | Admit: 2023-08-27 | Discharge: 2023-08-27 | Disposition: A | Discharge status: Home / Self Care | Payer: BC Managed Care – PPO | Attending: Emergency Medicine | Admitting: Emergency Medicine

## 2023-08-27 ENCOUNTER — Emergency Department (HOSPITAL_COMMUNITY): Payer: BC Managed Care – PPO

## 2023-08-27 ENCOUNTER — Encounter (HOSPITAL_COMMUNITY): Payer: Self-pay | Admitting: Emergency Medicine

## 2023-08-27 DIAGNOSIS — R079 Chest pain, unspecified: Secondary | ICD-10-CM | POA: Diagnosis not present

## 2023-08-27 DIAGNOSIS — R0789 Other chest pain: Secondary | ICD-10-CM | POA: Diagnosis not present

## 2023-08-27 DIAGNOSIS — I10 Essential (primary) hypertension: Secondary | ICD-10-CM | POA: Diagnosis not present

## 2023-08-27 HISTORY — DX: Essential (primary) hypertension: I10

## 2023-08-27 LAB — CBC
HCT: 34.3 % — ABNORMAL LOW (ref 36.0–46.0)
Hemoglobin: 9.7 g/dL — ABNORMAL LOW (ref 12.0–15.0)
MCH: 18.8 pg — ABNORMAL LOW (ref 26.0–34.0)
MCHC: 28.3 g/dL — ABNORMAL LOW (ref 30.0–36.0)
MCV: 66.5 fL — ABNORMAL LOW (ref 80.0–100.0)
Platelets: 424 10*3/uL — ABNORMAL HIGH (ref 150–400)
RBC: 5.16 MIL/uL — ABNORMAL HIGH (ref 3.87–5.11)
RDW: 18.4 % — ABNORMAL HIGH (ref 11.5–15.5)
WBC: 7.5 10*3/uL (ref 4.0–10.5)
nRBC: 0 % (ref 0.0–0.2)

## 2023-08-27 LAB — BASIC METABOLIC PANEL
Anion gap: 7 (ref 5–15)
BUN: 10 mg/dL (ref 6–20)
CO2: 21 mmol/L — ABNORMAL LOW (ref 22–32)
Calcium: 8.7 mg/dL — ABNORMAL LOW (ref 8.9–10.3)
Chloride: 107 mmol/L (ref 98–111)
Creatinine, Ser: 0.61 mg/dL (ref 0.44–1.00)
GFR, Estimated: >60 mL/min (ref >60)
Glucose, Bld: 87 mg/dL (ref 70–99)
Potassium: 3.9 mmol/L (ref 3.5–5.1)
Sodium: 135 mmol/L (ref 135–145)

## 2023-08-27 LAB — URINALYSIS, ROUTINE W REFLEX MICROSCOPIC
Bilirubin Urine: NEGATIVE
Glucose, UA: NEGATIVE mg/dL
Hgb urine dipstick: NEGATIVE
Ketones, ur: NEGATIVE mg/dL
Leukocytes,Ua: NEGATIVE
Nitrite: NEGATIVE
Protein, ur: NEGATIVE mg/dL
Specific Gravity, Urine: 1.006 (ref 1.005–1.030)
pH: 7 (ref 5.0–8.0)

## 2023-08-27 LAB — HCG, SERUM, QUALITATIVE: Preg, Serum: NEGATIVE

## 2023-08-27 LAB — TROPONIN I (HIGH SENSITIVITY)
Troponin I (High Sensitivity): 2 ng/L (ref <18)
Troponin I (High Sensitivity): 3 ng/L (ref <18)

## 2023-08-27 LAB — D-DIMER, QUANTITATIVE: D-Dimer, Quant: <0.27 ug{FEU}/mL (ref 0.00–0.50)

## 2023-08-27 NOTE — ED Triage Notes (Signed)
 Chest pain started a week ago. Sharp shooting pain radiating towards left arm intermittently. Pain worst today and started feeling light headed. Denies N/V/D.

## 2023-08-27 NOTE — Discharge Instructions (Signed)
 Blood work today did not show any signs of heart attack or blood clot.  You can try ibuprofen or Tylenol  as needed for the pain but follow-up with your doctor as planned.

## 2023-08-27 NOTE — ED Provider Triage Note (Signed)
 Emergency Medicine Provider Triage Evaluation Note  Sonya Floyd , a 33 y.o. female  was evaluated in triage.  Pt complains of chest pain.  Endorsed recurrent left upper chest pain with associated lightheadedness, shortness of breath, and fatigue ongoing for the past 2 weeks.  States she has history of anemia requiring blood transfusion and she has heavy menstruation.  Denies any fever chills nausea or diaphoresis does not have any significant cardiac history.  Review of Systems  Positive: As above Negative: As above  Physical Exam  BP (!) 150/110 (BP Location: Right Arm)   Pulse 77   Temp 98.3 F (36.8 C) (Oral)   Resp 15   Ht 5\' 2"  (1.575 m)   Wt 77.1 kg   LMP 08/12/2023 (Approximate)   SpO2 100%   BMI 31.09 kg/m  Gen:   Awake, no distress   Resp:  Normal effort  MSK:   Moves extremities without difficulty  Other:    Medical Decision Making  Medically screening exam initiated at 10:20 AM.  Appropriate orders placed.  Sonya Floyd was informed that the remainder of the evaluation will be completed by another provider, this initial triage assessment does not replace that evaluation, and the importance of remaining in the ED until their evaluation is complete.     Debbra Fairy, PA-C 08/27/23 1021

## 2023-08-27 NOTE — ED Provider Notes (Signed)
 [facility] EMERGENCY DEPARTMENT AT [facility] Provider Note   CSN: [id number] Arrival date & time: 08/27/23  0957     History  Chief Complaint  Patient presents with   Chest Pain    Sonya Floyd is a 33 y.o. female.  Patient is a 33 year old female with a history of anemia from heavy vaginal bleeding, hypertension hyperlipidemia who is presenting today with complaint of chest pain.  Patient reports the chest pain has been gradually worsening this week.  She reports on occasion she had had this pain before but nothing that has been as persistent as she has noticed this week.  It is in the center of her chest and in her left shoulder.  Sometimes it will go down into her arm.  It sometimes is worse with taking a deep breath with taking a deep breath and sometimes with activity.  Certain positions do not seem to make it worse.  She has not had recent URI symptoms or severe cough.  She does occasionally noticed some swelling in her legs but nothing that stays persistently and denies unilateral pain or swelling.  Patient reports that she has recently started OCPs in the last month as well as blood pressure medication.  She denies any prolonged immobilization or surgeries.  She does not use tobacco products and reports she really does not know what her family history is because she does not interact with family.  The history is provided by the patient and medical records.  Chest Pain      Home Medications Prior to Admission medications   Medication Sig Start Date End Date Taking? Authorizing Provider  ferrous sulfate  325 (65 FE) MG tablet Take 1 tablet (325 mg total) by mouth 2 (two) times daily with a meal. 01/11/23   Enrigue Harvard, DO  Multiple Vitamin (MULTIVITAMIN) tablet Take 1 tablet by mouth daily.    [provider]      Allergies    Patient has no known allergies.    Review of Systems   Review of Systems  Cardiovascular:  Positive for chest  pain.    Physical Exam Updated Vital Signs BP (!) 151/96   Pulse 81   Temp 98.1 F (36.7 C)   Resp 16   Ht 5\' 2"  (1.575 m)   Wt 77.1 kg   LMP 08/12/2023 (Approximate)   SpO2 99%   BMI 31.09 kg/m  Physical Exam Vitals and nursing note reviewed.  Constitutional:      General: She is not in acute distress.    Appearance: She is well-developed.  HENT:     Head: Normocephalic and atraumatic.  Eyes:     Pupils: Pupils are equal, round, and reactive to light.  Cardiovascular:     Rate and Rhythm: Normal rate and regular rhythm.     Pulses: Normal pulses.     Heart sounds: Normal heart sounds. No murmur heard.    No friction rub.  Pulmonary:     Effort: Pulmonary effort is normal.     Breath sounds: Normal breath sounds. No wheezing or rales.  Abdominal:     General: Bowel sounds are normal. There is no distension.     Palpations: Abdomen is soft.     Tenderness: There is no abdominal tenderness. There is no guarding or rebound.  Musculoskeletal:        General: No tenderness. Normal range of motion.     Cervical back: Normal range of motion.  Right lower leg: No edema.     Left lower leg: No edema.     Comments: No edema  Skin:    General: Skin is warm and dry.     Findings: No rash.  Neurological:     Mental Status: She is alert and oriented to person, place, and time. Mental status is at baseline.     Cranial Nerves: No cranial nerve deficit.  Psychiatric:        Behavior: Behavior normal.     ED Results / Procedures / Treatments   Labs (all labs ordered are listed, but only abnormal results are displayed) Labs Reviewed  BASIC METABOLIC PANEL - Abnormal; Notable for the following components:      Result Value   CO2 21 (*)    Calcium 8.7 (*)    All other components within normal limits  CBC - Abnormal; Notable for the following components:   RBC 5.16 (*)    Hemoglobin 9.7 (*)    HCT 34.3 (*)    MCV 66.5 (*)    MCH 18.8 (*)    MCHC 28.3 (*)    RDW  18.4 (*)    Platelets 424 (*)    All other components within normal limits  URINALYSIS, ROUTINE W REFLEX MICROSCOPIC - Abnormal; Notable for the following components:   Color, Urine STRAW (*)    APPearance HAZY (*)    All other components within normal limits  HCG, SERUM, QUALITATIVE  D-DIMER, QUANTITATIVE  TYPE AND SCREEN  TROPONIN I (HIGH SENSITIVITY)  TROPONIN I (HIGH SENSITIVITY)    EKG EKG Interpretation Date/Time:  Wednesday August 27 2023 10:17:52 EST Ventricular Rate:  77 PR Interval:  147 QRS Duration:  86 QT Interval:  371 QTC Calculation: 420 R Axis:   49  Text Interpretation: Sinus rhythm Right atrial enlargement No significant change since last tracing Confirmed by Almond Army ([id number]) on 08/27/2023 10:41:10 AM  Radiology DG Chest 2 View Result Date: 08/27/2023 CLINICAL DATA:  Chest pain EXAM: CHEST - 2 VIEW COMPARISON:  01/10/2023 FINDINGS: Cardiac and mediastinal contours are within normal limits. Redemonstrated calcified granuloma in the right upper lung. No new focal pulmonary opacity. No pleural effusion or pneumothorax. No acute osseous abnormality. IMPRESSION: No acute cardiopulmonary process. Electronically Signed   By: Zoila Hines M.D.   On: 08/27/2023 11:59    Procedures Procedures    Medications Ordered in ED Medications - No data to display  ED Course/ Medical Decision Making/ A&P                                 Medical Decision Making Amount and/or Complexity of Data Reviewed Labs: ordered. Decision-making details documented in ED Course. Radiology: ordered and independent interpretation performed. Decision-making details documented in ED Course. ECG/medicine tests: ordered and independent interpretation performed. Decision-making details documented in ED Course.   Pt with multiple medical problems and comorbidities and presenting today with a complaint that caries a high risk for morbidity and mortality.  Here today with nonspecific  chest pain.  Low risk Wells but patient is on OCPs need to rule out with a D-dimer versus ACS versus pleurisy.  Low suspicion for GERD or GI component as patient denies any history suggestive of that.  Also concern for possible worsening symptomatic anemia as she has had a prior history of anemia.  She has been compliant with her iron .  She is taking her blood  pressure medication regularly and reports she is also recently just started that.  Low suspicion for pneumothorax, pneumonia.  I independently interpreted patient's labs and EKG today UA is within normal limits, BMP without acute findings with normal creatinine and electrolytes, CBC with improved hemoglobin of 9.7 today from 8 the last time it was checked with normal white count.  Troponin x 2 is 2 and 3,pregnancy test is negative. D-dimer neg. EKG without acute changes. I have independently visualized and interpreted pt's images today.  Chest x-ray without acute findings. At this time no acute findings for patient's pain.  Low suspicion for PE and ACS given above findings.  Feel that patient is stable for discharge home.  Repeat blood pressure is 136/92.  She has follow-up with her PCP next week.  No indication for further testing or admission today.          Final Clinical Impression(s) / ED Diagnoses Final diagnoses:  Nonspecific chest pain    Rx / DC Orders ED Discharge Orders     None         Almond Army, MD 08/27/23 1541

## 2023-08-28 LAB — TYPE AND SCREEN
ABO/RH(D): A POS
Antibody Screen: POSITIVE
DAT, IgG: NEGATIVE
PT AG Type: NEGATIVE

## 2023-08-29 ENCOUNTER — Encounter: Payer: Self-pay | Admitting: Family Medicine

## 2023-08-29 ENCOUNTER — Ambulatory Visit (INDEPENDENT_AMBULATORY_CARE_PROVIDER_SITE_OTHER): Payer: BC Managed Care – PPO | Admitting: Family Medicine

## 2023-08-29 VITALS — BP 126/75 | HR 88 | Ht 62.0 in | Wt 179.0 lb

## 2023-08-29 DIAGNOSIS — R7989 Other specified abnormal findings of blood chemistry: Secondary | ICD-10-CM

## 2023-08-29 DIAGNOSIS — R0789 Other chest pain: Secondary | ICD-10-CM | POA: Diagnosis not present

## 2023-08-29 DIAGNOSIS — R946 Abnormal results of thyroid function studies: Secondary | ICD-10-CM | POA: Diagnosis not present

## 2023-08-29 DIAGNOSIS — R0609 Other forms of dyspnea: Secondary | ICD-10-CM

## 2023-08-29 DIAGNOSIS — Z1159 Encounter for screening for other viral diseases: Secondary | ICD-10-CM | POA: Diagnosis not present

## 2023-08-29 DIAGNOSIS — Z0000 Encounter for general adult medical examination without abnormal findings: Secondary | ICD-10-CM

## 2023-08-29 DIAGNOSIS — I10 Essential (primary) hypertension: Secondary | ICD-10-CM | POA: Insufficient documentation

## 2023-08-29 DIAGNOSIS — D500 Iron deficiency anemia secondary to blood loss (chronic): Secondary | ICD-10-CM | POA: Diagnosis not present

## 2023-08-29 DIAGNOSIS — N920 Excessive and frequent menstruation with regular cycle: Secondary | ICD-10-CM | POA: Insufficient documentation

## 2023-08-29 LAB — IBC + FERRITIN
Ferritin: 2.6 ng/mL — ABNORMAL LOW (ref 10.0–291.0)
Iron: 20 ug/dL — ABNORMAL LOW (ref 42–145)
Saturation Ratios: 4.1 % — ABNORMAL LOW (ref 20.0–50.0)
TIBC: 483 ug/dL — ABNORMAL HIGH (ref 250.0–450.0)
Transferrin: 345 mg/dL (ref 212.0–360.0)

## 2023-08-29 LAB — T4, FREE: Free T4: 0.87 ng/dL (ref 0.60–1.60)

## 2023-08-29 LAB — TSH: TSH: 1.83 u[IU]/mL (ref 0.35–5.50)

## 2023-08-29 NOTE — Progress Notes (Signed)
New Patient Office Visit  Subjective    Patient ID: Sonya Floyd, female    DOB: Sep 01, 1990  Age: 33 y.o. MRN: [id number]  CC:  Chief Complaint  Patient presents with   Establish Care    HPI Sonya Floyd presents to establish care. She lives with her 3 children and works at [employer] as a Engineer, materials.    Discussed the use of AI scribe software for clinical note transcription with the patient, who gave verbal consent to proceed.  History of Present Illness   The patient, a bank teller and parent of three, presents with a history of anemia, high cholesterol, and high blood pressure. She reports a recent visit to the emergency department due to chest discomfort, which has been gradually worsening over the past week. The discomfort is located in the center of the chest, radiating to the left shoulder and sometimes down the arm. It is exacerbated by deep breaths and activity. The patient also reports occasional shortness of breath, both during these episodes of chest discomfort and during physical exertion such as climbing stairs or running.  The patient has a history of heavy and prolonged menstrual bleeding, which she believes has contributed to her anemia. She reports that her periods can last up to 10-12 days, with heavy flow and clotting, particularly in the first few days. She was recently started on Micronor by her gynecologist Deboraha Sprang) to see if this will improve her bleeding. States she has a follow-up and ultrasound with them soon - we will request records. She has been taking iron supplements twice daily for several months, but her hemoglobin levels remain low, necessitating two blood transfusions in the past two years.  The patient also reports occasional palpitations, describing them as a heavy heartbeat that resolves on its own after a few minutes. These episodes are infrequent and do not appear to be associated with any particular triggers. The patient is  currently on several medications, including iron supplements, Lisinopril for high blood pressure, and a birth control pill to manage her heavy menstrual bleeding. Recent ED visit unremarkable with EKG (SR, RAE), stable labs other than anemia - she was encouraged to establish with a PCP.             08/29/2023    9:29 AM  PHQ9 SCORE ONLY  PHQ-9 Total Score 12      08/29/2023    9:29 AM  GAD 7 : Generalized Anxiety Score  Nervous, Anxious, on Edge 0  Control/stop worrying 0  Worry too much - different things 0  Trouble relaxing 1  Restless 0  Easily annoyed or irritable 0  Afraid - awful might happen 0  Total GAD 7 Score 1  Anxiety Difficulty Somewhat difficult         Outpatient Encounter Medications as of 08/29/2023  Medication Sig   ferrous sulfate 325 (65 FE) MG tablet Take 1 tablet (325 mg total) by mouth 2 (two) times daily with a meal.   lisinopril (ZESTRIL) 10 MG tablet Take 10 mg by mouth daily.   Multiple Vitamin (MULTIVITAMIN) tablet Take 1 tablet by mouth daily.   norethindrone (MICRONOR) 0.35 MG tablet Take 1 tablet by mouth daily.   No facility-administered encounter medications on file as of 08/29/2023.    Past Medical History:  Diagnosis Date   Anemia    Anemia    High cholesterol    Hypertension     Past Surgical History:  Procedure Laterality Date  CESAREAN SECTION  11/06/2009   CESAREAN SECTION  07/11/2011   CESAREAN SECTION  01/20/2015   CESAREAN SECTION     TUBAL LIGATION  01/2017    History reviewed. No pertinent family history.  Social History   Socioeconomic History   Marital status: Single    Spouse name: Not on file   Number of children: Not on file   Years of education: Not on file   Highest education level: Not on file  Occupational History   Not on file  Tobacco Use   Smoking status: Never   Smokeless tobacco: Never  Vaping Use   Vaping status: Never Used  Substance and Sexual Activity   Alcohol use: Yes     Comment: occ   Drug use: No   Sexual activity: Yes    Partners: Male    Birth control/protection: Surgical  Other Topics Concern   Not on file  Social History Narrative   Not on file   Social Drivers of Health   Financial Resource Strain: Not on file  Food Insecurity: No Food Insecurity (03/08/2022)   Received from [facility], [facility]   Hunger Vital Sign    Worried About Running Out of Food in the Last Year: Never true    Ran Out of Food in the Last Year: Never true  Transportation Needs: Not on file  Physical Activity: Not on file  Stress: Not on file  Social Connections: Unknown (02/09/2022)   Received from [facility], [facility]   Social Network    Social Network: Not on file  Intimate Partner Violence: Unknown (02/09/2022)   Received from [facility], [facility]   HITS    Physically Hurt: Not on file    Insult or Talk Down To: Not on file    Threaten Physical Harm: Not on file    Scream or Curse: Not on file    ROS All review of systems negative except what is listed in the HPI    Objective    BP 126/75   Pulse 88   Ht 5\' 2"  (1.575 m)   Wt 179 lb (81.2 kg)   LMP 08/12/2023 (Approximate)   SpO2 99%   BMI 32.74 kg/m   Physical Exam Vitals reviewed.  Constitutional:      Appearance: Normal appearance.  Cardiovascular:     Rate and Rhythm: Normal rate and regular rhythm.     Heart sounds: Normal heart sounds. No murmur heard. Pulmonary:     Effort: Pulmonary effort is normal.     Breath sounds: Normal breath sounds.  Musculoskeletal:     Cervical back: Normal range of motion and neck supple.     Right lower leg: No edema.     Left lower leg: No edema.  Lymphadenopathy:     Cervical: No cervical adenopathy.  Skin:    General: Skin is warm and dry.  Neurological:     Mental Status: She is alert and oriented to person, place, and time.  Psychiatric:        Mood and Affect: Mood normal.        Behavior: Behavior normal.         Thought Content: Thought content normal.        Judgment: Judgment normal.         Assessment & Plan:   Problem List Items Addressed This Visit       Active Problems   Iron deficiency anemia due to chronic blood loss - Primary  History of heavy menstrual bleeding and previous blood transfusions. Currently on iron supplementation twice daily. Recent hemoglobin level of 9.7. -Order iron and thyroid labs today. -Consider referral to a hematologist if iron levels are significantly low despite oral supplementation.      Relevant Orders   IBC + Ferritin (Completed)   Menorrhagia with regular cycle   Reports heavy menstrual bleeding lasting 10-12 days per cycle, contributing to anemia. Currently on norethindrone for bleeding control. -Continue norethindrone as prescribed. -Follow up with gynecologist for further management, including upcoming ultrasound.      Primary hypertension   Currently managed on Lisinopril 10mg  daily. Recent blood pressure reading within normal limits. -Continue Lisinopril 10mg  daily. -Monitor blood pressure regularly.       Relevant Medications   lisinopril (ZESTRIL) 10 MG tablet   Other Visit Diagnoses       Encounter for medical examination to establish care          Abnormal TSH       Relevant Orders   TSH (Completed)   T4, free (Completed)     Chest discomfort     DOE (dyspnea on exertion)  Recent onset of chest pain radiating to left shoulder and arm, associated with shortness of breath. Recent ED visit ruled out acute cardiac event. -Order echocardiogram for further cardiac evaluation. -Refer to cardiology for further evaluation and management.   Relevant Orders   Ambulatory referral to Cardiology   ECHOCARDIOGRAM COMPLETE     Encounter for hepatitis C screening test for low risk patient       Relevant Orders   Hepatitis C antibody       Return for - pending results or as needed .   Clayborne Dana, NP

## 2023-08-29 NOTE — Addendum Note (Signed)
Addended by: Hyman Hopes B on: 08/29/2023 04:24 PM   Modules accepted: Orders

## 2023-08-29 NOTE — Patient Instructions (Signed)
 Thank you for choosing [facility] for your Primary Care needs. I am excited for the opportunity to partner with you to meet your health care goals. It was a pleasure meeting you today!  Information on diet, exercise, and health maintenance recommendations are listed below. This is information to help you be sure you are on track for optimal health and monitoring.   Please look over this and let us know if you have any questions or if you have completed any of the health maintenance outside of [facility] so that we can be sure your records are up to date.  ___________________________________________________________  MyChart:  For all urgent or time sensitive needs we ask that you please call the office to avoid delays. Our number is [phone]. MyChart is not constantly monitored and due to the large volume of messages a day, replies may take up to 72 business hours.  MyChart Policy: MyChart allows for you to see your visit notes, after visit summary, provider recommendations, lab and tests results, make an appointment, request refills, and contact your provider or the office for non-urgent questions or concerns. Providers are seeing patients during normal business hours and do not have built in time to review MyChart messages.  We ask that you allow a minimum of 3 business days for responses to [employer]. For this reason, please do not send urgent requests through MyChart. Please call the office at [phone]. New and ongoing conditions may require a visit. We have virtual and in-person visits available for your convenience.  Complex MyChart concerns may require a visit. Your provider may request you schedule a virtual or in-person visit to ensure we are providing the best care possible. MyChart messages sent after 11:00 AM on Friday may not be received by the provider until Monday morning.    Lab and Test Results: You will receive your lab and test  results on MyChart as soon as they are completed and results have been sent by the lab or testing facility. Due to this service, you will receive your results BEFORE your provider.  I review lab and test results each morning prior to seeing patients. Some results require collaboration with other providers to ensure you are receiving the most appropriate care. For this reason, we ask that you please allow a minimum of 3-5 business days from the time that ALL results have been received for your provider to receive and review lab and test results and contact you about these.  Most lab and test result comments from the provider will be sent through MyChart. Your provider may recommend changes to the plan of care, follow-up visits, repeat testing, ask questions, or request an office visit to discuss these results. You may reply directly to this message or call the office to provide information for the provider or set up an appointment. In some instances, you will be called with test results and recommendations. Please let us know if this is preferred and we will make note of this in your chart to provide this for you.    If you have not heard a response to your lab or test results in 5 business days from all results returning to MyChart, please call the office to let us know. We ask that you please avoid calling prior to this time unless there is an emergent concern. Due to high call volumes, this can delay the resulting process.  After Hours: For all non-emergency after hours needs, please  call the office at [phone] and select the option to reach the on-call  service. On-call services are shared between multiple [place] offices and therefore it will not be possible to speak directly with your provider. On-call providers may provide medical advice and recommendations, but are unable to provide refills for maintenance medications.  For all emergency or urgent medical needs after normal business hours, we  recommend that you seek care at the closest Urgent Care or Emergency Department to ensure appropriate treatment in a timely manner.  [facility] has a 24 hour emergency room located on the ground floor for your convenience.   Urgent Concerns During the Business Day Providers are seeing patients from 8AM to 5PM with a busy schedule and are most often not able to respond to non-urgent calls until the end of the day or the next business day. If you should have URGENT concerns during the day, please call and speak to the nurse or schedule a same day appointment so that we can address your concern without delay.   Thank you, again, for choosing me as your health care partner. I appreciate your trust and look forward to learning more about you!   Lollie Marrow Reola Calkins, DNP, FNP-C  ___________________________________________________________  Health Maintenance Recommendations Screening Testing Mammogram Every 1-2 years based on history and risk factors Starting at age 89 Pap Smear Ages 21-39 every 3 years Ages 67-65 every 5 years with HPV testing More frequent testing may be required based on results and history Colon Cancer Screening Every 1-10 years based on test performed, risk factors, and history Starting at age 108 Bone Density Screening Every 2-10 years based on history Starting at age 80 for women Recommendations for men differ based on medication usage, history, and risk factors AAA Screening One time ultrasound Men 52-68 years old who have ever smoked Lung Cancer Screening Low Dose Lung CT every 12 months Age 29-80 years with a 20 pack-year smoking history who still smoke or who have quit within the last 15 years  Screening Labs Routine  Labs: Complete Blood Count (CBC), Complete Metabolic Panel (CMP), Cholesterol (Lipid Panel) Every 6-12 months based on history and medications May be recommended more frequently based on current conditions or previous results Hemoglobin  A1c Lab Every 3-12 months based on history and previous results Starting at age 1 or earlier with diagnosis of diabetes, high cholesterol, BMI >26, and/or risk factors Frequent monitoring for patients with diabetes to ensure blood sugar control Thyroid Panel  Every 6 months based on history, symptoms, and risk factors May be repeated more often if on medication HIV One time testing for all patients 92 and older May be repeated more frequently for patients with increased risk factors or exposure Hepatitis C One time testing for all patients 19 and older May be repeated more frequently for patients with increased risk factors or exposure Gonorrhea, Chlamydia Every 12 months for all sexually active persons 13-24 years Additional monitoring may be recommended for those who are considered high risk or who have symptoms PSA Men 34-21 years old with risk factors Additional screening may be recommended from age 37-69 based on risk factors, symptoms, and history  Vaccine Recommendations Tetanus Booster All adults every 10 years Flu Vaccine All patients 6 months and older every year COVID Vaccine All patients 12 years and older Initial dosing with booster May recommend additional booster based on age and health history HPV Vaccine 2 doses all patients age 3-26 Dosing may be considered  for patients over 26 Shingles Vaccine (Shingrix) 2 doses all adults 50 years and older Pneumonia (Pneumovax 23) All adults 65 years and older May recommend earlier dosing based on health history Pneumonia (Prevnar 60) All adults 65 years and older Dosed 1 year after Pneumovax 23 Pneumonia (Prevnar 20) All adults 65 years and older (adults 19-64 with certain conditions or risk factors) 1 dose  For those who have not received Prevnar 13 vaccine previously   Additional Screening, Testing, and Vaccinations may be recommended on an individualized basis based on family history, health history, risk  factors, and/or exposure.  __________________________________________________________  Diet Recommendations for All Patients  I recommend that all patients maintain a diet low in saturated fats, carbohydrates, and cholesterol. While this can be challenging at first, it is not impossible and small changes can make big differences.  Things to try: Decreasing the amount of soda, sweet tea, and/or juice to one or less per day and replace with water While water is always the first choice, if you do not like water you may consider adding a water additive without sugar to improve the taste other sugar free drinks Replace potatoes with a brightly colored vegetable  Use healthy oils, such as canola oil or olive oil, instead of butter or hard margarine Limit your bread intake to two pieces or less a day Replace regular pasta with low carb pasta options Bake, broil, or grill foods instead of frying Monitor portion sizes  Eat smaller, more frequent meals throughout the day instead of large meals  An important thing to remember is, if you love foods that are not great for your health, you don't have to give them up completely. Instead, allow these foods to be a reward when you have done well. Allowing yourself to still have special treats every once in a while is a nice way to tell yourself thank you for working hard to keep yourself healthy.   Also remember that every day is a new day. If you have a bad day and "fall off the wagon", you can still climb right back up and keep moving along on your journey!  We have resources available to help you!  Some websites that may be helpful include: www.http://www.wall-[place].info/  Www.VeryWellFit.com _____________________________________________________________  Activity Recommendations for All Patients  I recommend that all adults get at least 30 minutes of moderate physical activity that elevates your heart rate at least 5 days out of the week.  Some examples  include: Walking or jogging at a pace that allows you to carry on a conversation Cycling (stationary bike or outdoors) Water aerobics Yoga Weight lifting Dancing If physical limitations prevent you from putting stress on your joints, exercise in a pool or seated in a chair are excellent options.  Do determine your MAXIMUM heart rate for activity: 220 - YOUR AGE = MAX Heart Rate   Remember! Do not push yourself too hard.  Start slowly and build up your pace, speed, weight, time in exercise, etc.  Allow your body to rest between exercise and get good sleep. You will need more water than normal when you are exerting yourself. Do not wait until you are thirsty to drink. Drink with a purpose of getting in at least 8, 8 ounce glasses of water a day plus more depending on how much you exercise and sweat.    If you begin to develop dizziness, chest pain, abdominal pain, jaw pain, shortness of breath, headache, vision changes, lightheadedness, or other concerning symptoms,  stop the activity and allow your body to rest. If your symptoms are severe, seek emergency evaluation immediately. If your symptoms are concerning, but not severe, please let us know so that we can recommend further evaluation.

## 2023-08-29 NOTE — Assessment & Plan Note (Signed)
Reports heavy menstrual bleeding lasting 10-12 days per cycle, contributing to anemia. Currently on norethindrone for bleeding control. -Continue norethindrone as prescribed. -Follow up with gynecologist for further management, including upcoming ultrasound.

## 2023-08-29 NOTE — Assessment & Plan Note (Signed)
Currently managed on Lisinopril 10mg  daily. Recent blood pressure reading within normal limits. -Continue Lisinopril 10mg  daily. -Monitor blood pressure regularly.

## 2023-08-29 NOTE — Assessment & Plan Note (Signed)
History of heavy menstrual bleeding and previous blood transfusions. Currently on iron supplementation twice daily. Recent hemoglobin level of 9.7. -Order iron and thyroid labs today. -Consider referral to a hematologist if iron levels are significantly low despite oral supplementation.

## 2023-08-30 LAB — HEPATITIS C ANTIBODY: Hepatitis C Ab: NONREACTIVE

## 2023-09-02 DIAGNOSIS — N921 Excessive and frequent menstruation with irregular cycle: Secondary | ICD-10-CM | POA: Diagnosis not present

## 2023-09-11 ENCOUNTER — Encounter: Payer: Self-pay | Admitting: Hematology & Oncology

## 2023-09-29 ENCOUNTER — Inpatient Hospital Stay: Payer: BC Managed Care – PPO | Attending: Family | Admitting: Family

## 2023-09-29 ENCOUNTER — Inpatient Hospital Stay: Payer: BC Managed Care – PPO

## 2023-09-29 ENCOUNTER — Encounter: Payer: Self-pay | Admitting: Family

## 2023-09-29 ENCOUNTER — Ambulatory Visit (HOSPITAL_BASED_OUTPATIENT_CLINIC_OR_DEPARTMENT_OTHER)
Admission: RE | Admit: 2023-09-29 | Discharge: 2023-09-29 | Disposition: A | Discharge status: Home / Self Care | Payer: BC Managed Care – PPO | Source: Ambulatory Visit | Attending: Family Medicine | Admitting: Family Medicine

## 2023-09-29 VITALS — BP 131/87 | HR 83 | Temp 98.7°F | Resp 17 | Wt 177.0 lb

## 2023-09-29 DIAGNOSIS — R042 Hemoptysis: Secondary | ICD-10-CM | POA: Insufficient documentation

## 2023-09-29 DIAGNOSIS — N920 Excessive and frequent menstruation with regular cycle: Secondary | ICD-10-CM | POA: Diagnosis not present

## 2023-09-29 DIAGNOSIS — R0609 Other forms of dyspnea: Secondary | ICD-10-CM | POA: Diagnosis not present

## 2023-09-29 DIAGNOSIS — R002 Palpitations: Secondary | ICD-10-CM | POA: Diagnosis not present

## 2023-09-29 DIAGNOSIS — R059 Cough, unspecified: Secondary | ICD-10-CM | POA: Diagnosis not present

## 2023-09-29 DIAGNOSIS — I10 Essential (primary) hypertension: Secondary | ICD-10-CM | POA: Insufficient documentation

## 2023-09-29 DIAGNOSIS — Z79899 Other long term (current) drug therapy: Secondary | ICD-10-CM | POA: Insufficient documentation

## 2023-09-29 DIAGNOSIS — R531 Weakness: Secondary | ICD-10-CM | POA: Insufficient documentation

## 2023-09-29 DIAGNOSIS — R5383 Other fatigue: Secondary | ICD-10-CM | POA: Insufficient documentation

## 2023-09-29 DIAGNOSIS — R0789 Other chest pain: Secondary | ICD-10-CM | POA: Insufficient documentation

## 2023-09-29 DIAGNOSIS — D500 Iron deficiency anemia secondary to blood loss (chronic): Secondary | ICD-10-CM | POA: Diagnosis not present

## 2023-09-29 DIAGNOSIS — R0602 Shortness of breath: Secondary | ICD-10-CM | POA: Diagnosis not present

## 2023-09-29 DIAGNOSIS — R202 Paresthesia of skin: Secondary | ICD-10-CM | POA: Diagnosis not present

## 2023-09-29 LAB — ECHOCARDIOGRAM COMPLETE
AR max vel: 2.55 cm2
AV Area VTI: 2.49 cm2
AV Area mean vel: 2.53 cm2
AV Mean grad: 4 mm[Hg]
AV Peak grad: 7 mm[Hg]
Ao pk vel: 1.33 m/s
Area-P 1/2: 4.57 cm2
Calc EF: 57.4 %
MV M vel: 1.67 m/s
MV Peak grad: 11.2 mm[Hg]
S' Lateral: 3.3 cm
Single Plane A2C EF: 57 %
Single Plane A4C EF: 59.5 %

## 2023-09-29 NOTE — Progress Notes (Unsigned)
Hematology/Oncology Consultation   Name: Sonya Floyd      MRN: [id number]    Location: Room/bed info not found  Date: 09/29/2023 Time:3:11 PM   REFERRING PHYSICIAN: Hyman Hopes, NP  REASON FOR CONSULT: Iron deficiency anemia    DIAGNOSIS: Iron deficiency anemia secondary to heavy cycles  HISTORY OF PRESENT ILLNESS: Sonya Floyd is a pleasant 32 yo female with IDA secondary to heavy cycles.  Her cycle has been heavy with large clots lasting up to 15 days ion length with break through spotting in between cycles. She recently started Micronor and notes that her clots seem to be smaller.  She has required blood in the past for severe anemia secondary to heavy cycles. Hgb has been as low as 5.6.  She has not received IV iron before.  Iron saturation last month was only 4% and ferritin 2.  She will not benefit from oral iron at this point.  She has had light blood tinged sputum once with cough. No other blood loss noted.  No abnormal bruising, no petechiae.  She has 3 children and had 3 c-sections without any complications. No history of miscarriage. She has had her tubes tied.  She is symptomatic with fatigue, weakness, SOB, with exertion, chest discomfort/palpitations and craving ice.  No known familial history of anemia.  She grew up in [place] raised by her AMAZING grandmother.  She has history of migraines.  No personal or known familial history of cancer.  No history of diabetes or thyroid disease.  No history of EGD or colonoscopy per patient.  No fever, chills, n/v, cough, rash, dizziness, abdominal pain or changes in bowel or bladder habits.  No swelling, tenderness, numbness or tingling in her extremities at this time.  She has occasional tingling in the fingers of her right hands due to carpal tunnel. She wears a brace as needed.  No falls or syncope reported.  No smoking, ETOH or recreational drug use.  Appetite and hydration are good. Weight is stable at 177 lbs.   She stays busy with her family and also works for [employer].   ROS: All other 10 point review of systems is negative.   PAST MEDICAL HISTORY:   Past Medical History:  Diagnosis Date   Anemia    Anemia    High cholesterol    Hypertension     ALLERGIES: No Known Allergies    MEDICATIONS:  Current Outpatient Medications on File Prior to Visit  Medication Sig Dispense Refill   ferrous sulfate 325 (65 FE) MG tablet Take 1 tablet (325 mg total) by mouth 2 (two) times daily with a meal. 30 tablet 1   lisinopril (ZESTRIL) 10 MG tablet Take 10 mg by mouth daily.     Multiple Vitamin (MULTIVITAMIN) tablet Take 1 tablet by mouth daily.     norethindrone (MICRONOR) 0.35 MG tablet Take 1 tablet by mouth daily.     No current facility-administered medications on file prior to visit.     PAST SURGICAL HISTORY Past Surgical History:  Procedure Laterality Date   CESAREAN SECTION  11/06/2009   CESAREAN SECTION  07/11/2011   CESAREAN SECTION  01/20/2015   CESAREAN SECTION     TUBAL LIGATION  01/2017    FAMILY HISTORY: No family history on file.  SOCIAL HISTORY:  reports that she has never smoked. She has never used smokeless tobacco. She reports current alcohol use. She reports that she does not use drugs.  PERFORMANCE STATUS: The patient's  performance status is 1 - Symptomatic but completely ambulatory  PHYSICAL EXAM: Most Recent Vital Signs: There were no vitals taken for this visit. BP 131/87 (BP Location: Right Arm, Patient Position: Sitting, Cuff Size: Normal)   Pulse 83   Temp 98.7 F (37.1 C) (Oral)   Resp 17   Wt 177 lb 0.6 oz (80.3 kg)   SpO2 100%   BMI 32.38 kg/m   General Appearance:    Alert, cooperative, no distress, appears stated age  Head:    Normocephalic, without obvious abnormality, atraumatic  Eyes:    PERRL, conjunctiva/corneas clear, EOM's intact, fundi    benign, both eyes        Throat:   Lips, mucosa, and tongue normal; teeth  and gums normal  Neck:   Supple, symmetrical, trachea midline, no adenopathy;    thyroid:  no enlargement/tenderness/nodules; no carotid   bruit or JVD  Back:     Symmetric, no curvature, ROM normal, no CVA tenderness  Lungs:     Clear to auscultation bilaterally, respirations unlabored  Chest Wall:    No tenderness or deformity   Heart:    Regular rate and rhythm, S1 and S2 normal, no murmur, rub   or gallop     Abdomen:     Soft, non-tender, bowel sounds active all four quadrants,    no masses, no organomegaly        Extremities:   Extremities normal, atraumatic, no cyanosis or edema  Pulses:   2+ and symmetric all extremities  Skin:   Skin color, texture, turgor normal, no rashes or lesions  Lymph nodes:   Cervical, supraclavicular, and axillary nodes normal  Neurologic:   CNII-XII intact, normal strength, sensation and reflexes    throughout    LABORATORY DATA:  No results found for this or any previous visit (from the past 48 hours).    RADIOGRAPHY: ECHOCARDIOGRAM COMPLETE Result Date: 09/29/2023    ECHOCARDIOGRAM REPORT   Patient Name:   Sonya Floyd [facility] Date of Exam: 09/29/2023 Medical Rec #:  [id number]                 Height:       62.0 in Accession #:    [id number]                Weight:       179.0 lb Date of Birth:  Dec 10, 1990                 BSA:          1.824 m Patient Age:    32 years                  BP:           126/75 mmHg Patient Gender: F                         HR:           62 bpm. Exam Location:  [place] Procedure: 2D Echo, Cardiac Doppler and Color Doppler (Both Spectral and Color            Flow Doppler were utilized during procedure). Indications:    R06.9 DOE; R07.9* Chest pain, unspecified  History:        Patient has no prior history of Echocardiogram examinations.                 Anemia, Signs/Symptoms:Chest Pain and Dyspnea;  Risk                 Factors:Hypertension and Non-Smoker.  Sonographer:    Jake Seats RDMS, RVT, RDCS Referring Phys:  [id number] Lollie Marrow BECK IMPRESSIONS  1. Left ventricular ejection fraction, by estimation, is 60 to 65%. The left ventricle has normal function. The left ventricle has no regional wall motion abnormalities. Left ventricular diastolic parameters were normal.  2. Right ventricular systolic function is normal. The right ventricular size is normal.  3. There is normal pulmonary artery systolic pressure. The estimated right ventricular systolic pressure is 11.8 mmHg.  4. The mitral valve is grossly normal. Trivial mitral valve regurgitation. No evidence of mitral stenosis.  5. The aortic valve is grossly normal. Aortic valve regurgitation is not visualized. No aortic stenosis is present.  6. The inferior vena cava is normal in size with greater than 50% respiratory variability, suggesting right atrial pressure of 3 mmHg. FINDINGS  Left Ventricle: Left ventricular ejection fraction, by estimation, is 60 to 65%. The left ventricle has normal function. The left ventricle has no regional wall motion abnormalities. Strain imaging was not performed. The left ventricular internal cavity  size was normal in size. There is no left ventricular hypertrophy. Left ventricular diastolic parameters were normal. Right Ventricle: The right ventricular size is normal. Right vetricular wall thickness was not well visualized. Right ventricular systolic function is normal. There is normal pulmonary artery systolic pressure. The tricuspid regurgitant velocity is 1.48 m/s, and with an assumed right atrial pressure of 3 mmHg, the estimated right ventricular systolic pressure is 11.8 mmHg. Left Atrium: Left atrial size was normal in size. Right Atrium: Right atrial size was normal in size. Pericardium: There is no evidence of pericardial effusion. Mitral Valve: The mitral valve is grossly normal. Trivial mitral valve regurgitation. No evidence of mitral valve stenosis. Tricuspid Valve: The tricuspid valve is grossly normal. Tricuspid valve  regurgitation is trivial. No evidence of tricuspid stenosis. Aortic Valve: The aortic valve is grossly normal. Aortic valve regurgitation is not visualized. No aortic stenosis is present. Aortic valve mean gradient measures 4.0 mmHg. Aortic valve peak gradient measures 7.0 mmHg. Aortic valve area, by VTI measures 2.49 cm. Pulmonic Valve: The pulmonic valve was grossly normal. Pulmonic valve regurgitation is trivial. No evidence of pulmonic stenosis. Aorta: The aortic root and ascending aorta are structurally normal, with no evidence of dilitation. Venous: The inferior vena cava is normal in size with greater than 50% respiratory variability, suggesting right atrial pressure of 3 mmHg. IAS/Shunts: The interatrial septum was not well visualized. Additional Comments: 3D imaging was not performed.  LEFT VENTRICLE PLAX 2D LVIDd:         5.00 cm      Diastology LVIDs:         3.30 cm      LV e' medial:    10.10 cm/s LV PW:         1.10 cm      LV E/e' medial:  9.7 LV IVS:        1.10 cm      LV e' lateral:   11.20 cm/s LVOT diam:     1.90 cm      LV E/e' lateral: 8.7 LV SV:         71 LV SV Index:   39 LVOT Area:     2.84 cm  LV Volumes (MOD) LV vol d, MOD A2C: 85.5 ml LV vol d, MOD A4C: 101.0 ml LV  vol s, MOD A2C: 36.8 ml LV vol s, MOD A4C: 40.9 ml LV SV MOD A2C:     48.7 ml LV SV MOD A4C:     101.0 ml LV SV MOD BP:      55.5 ml RIGHT VENTRICLE RV S prime:     13.60 cm/s TAPSE (M-mode): 2.4 cm LEFT ATRIUM             Index        RIGHT ATRIUM           Index LA diam:        3.00 cm 1.65 cm/m   RA Area:     14.80 cm LA Vol (A2C):   40.5 ml 22.21 ml/m  RA Volume:   43.00 ml  23.58 ml/m LA Vol (A4C):   46.8 ml 25.66 ml/m LA Biplane Vol: 47.3 ml 25.94 ml/m  AORTIC VALVE AV Area (Vmax):    2.55 cm AV Area (Vmean):   2.53 cm AV Area (VTI):     2.49 cm AV Vmax:           132.50 cm/s AV Vmean:          99.300 cm/s AV VTI:            0.286 m AV Peak Grad:      7.0 mmHg AV Mean Grad:      4.0 mmHg LVOT Vmax:          119.00 cm/s LVOT Vmean:        88.600 cm/s LVOT VTI:          0.251 m LVOT/AV VTI ratio: 0.88  AORTA Ao Root diam: 2.80 cm Ao Asc diam:  2.90 cm MITRAL VALVE               TRICUSPID VALVE MV Area (PHT): 4.57 cm    TR Peak grad:   8.8 mmHg MV Decel Time: 166 msec    TR Vmax:        148.00 cm/s MR Peak grad: 11.2 mmHg MR Vmax:      167.33 cm/s  SHUNTS MV E velocity: 97.70 cm/s  Systemic VTI:  0.25 m MV A velocity: 58.70 cm/s  Systemic Diam: 1.90 cm MV E/A ratio:  1.66 Sreedhar reddy Madireddy Electronically signed by Vern Claude reddy Madireddy Signature Date/Time: 09/29/2023/12:08:16 PM    Final        PATHOLOGY: None   ASSESSMENT/PLAN: Ms. Puccini is a pleasant 33 yo female with IDA secondary to heavy cycles.  She is working with her gynecologist to regulate her cycle.  We will get her set uo for 3 doses of IV iron with WL per patient request. 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  Follow-up in 6 weeks.   All questions were answered. The patient knows to call the clinic with any problems, questions or concerns. We can certainly see the patient much sooner if necessary.  The patient was discussed with and also seen by Dr. Myna Hidalgo and he is in agreement with the aforementioned.   Sonya Floyd

## 2023-09-30 ENCOUNTER — Encounter: Payer: Self-pay | Admitting: Family Medicine

## 2023-09-30 ENCOUNTER — Encounter: Payer: Self-pay | Admitting: Family

## 2023-10-01 ENCOUNTER — Telehealth: Payer: Self-pay | Admitting: Hematology and Oncology

## 2023-10-09 ENCOUNTER — Telehealth: Payer: Self-pay | Admitting: Physician Assistant

## 2023-10-10 ENCOUNTER — Inpatient Hospital Stay: Payer: BC Managed Care – PPO

## 2023-10-11 ENCOUNTER — Inpatient Hospital Stay: Payer: BC Managed Care – PPO

## 2023-10-11 ENCOUNTER — Encounter: Payer: Self-pay | Admitting: Family

## 2023-10-17 MED FILL — Iron Sucrose Inj 20 MG/ML (Fe Equiv): INTRAVENOUS | Qty: 15 | Status: AC

## 2023-10-18 ENCOUNTER — Encounter: Payer: Self-pay | Admitting: Family

## 2023-10-18 ENCOUNTER — Inpatient Hospital Stay: Payer: BC Managed Care – PPO | Attending: Family

## 2023-10-18 VITALS — BP 140/96 | HR 60 | Temp 98.4°F | Resp 16

## 2023-10-18 DIAGNOSIS — Z79899 Other long term (current) drug therapy: Secondary | ICD-10-CM | POA: Diagnosis not present

## 2023-10-18 DIAGNOSIS — N920 Excessive and frequent menstruation with regular cycle: Secondary | ICD-10-CM | POA: Diagnosis not present

## 2023-10-18 DIAGNOSIS — D500 Iron deficiency anemia secondary to blood loss (chronic): Secondary | ICD-10-CM | POA: Insufficient documentation

## 2023-10-18 MED ORDER — SODIUM CHLORIDE 0.9 % IV SOLN
300.0000 mg | Freq: Once | INTRAVENOUS | Status: AC
  Administered 2023-10-18: 300 mg via INTRAVENOUS
  Filled 2023-10-18: qty 300

## 2023-10-18 MED ORDER — SODIUM CHLORIDE 0.9 % IV SOLN: INTRAVENOUS | Status: DC

## 2023-10-18 NOTE — Progress Notes (Signed)
 Patient tolerated IV iron infusion well. Monitored for 30 minute post observation period without incident. VSS, ambulatory to the lobby.

## 2023-10-18 NOTE — Patient Instructions (Signed)

## 2023-10-25 ENCOUNTER — Inpatient Hospital Stay: Payer: BC Managed Care – PPO

## 2023-10-25 VITALS — BP 134/87 | HR 64 | Temp 98.9°F | Resp 16

## 2023-10-25 DIAGNOSIS — Z79899 Other long term (current) drug therapy: Secondary | ICD-10-CM | POA: Diagnosis not present

## 2023-10-25 DIAGNOSIS — D500 Iron deficiency anemia secondary to blood loss (chronic): Secondary | ICD-10-CM | POA: Diagnosis not present

## 2023-10-25 DIAGNOSIS — N920 Excessive and frequent menstruation with regular cycle: Secondary | ICD-10-CM | POA: Diagnosis not present

## 2023-10-25 MED ORDER — IRON SUCROSE 20 MG/ML IV SOLN
300.0000 mg | Freq: Once | INTRAVENOUS | Status: AC
  Administered 2023-10-25: 300 mg via INTRAVENOUS
  Filled 2023-10-25: qty 300

## 2023-10-25 MED ORDER — SODIUM CHLORIDE 0.9 % IV SOLN: INTRAVENOUS | Status: DC

## 2023-10-25 NOTE — Patient Instructions (Signed)

## 2023-10-25 NOTE — Progress Notes (Signed)
 Tolerated infusion well, declined to stay for 30 minute post infusion observation. VSS, discharged alert and ambulatory.

## 2023-11-12 ENCOUNTER — Inpatient Hospital Stay: Payer: BC Managed Care – PPO | Admitting: Family

## 2023-11-12 ENCOUNTER — Inpatient Hospital Stay: Payer: BC Managed Care – PPO

## 2023-11-24 ENCOUNTER — Inpatient Hospital Stay: Admitting: Family

## 2023-11-24 ENCOUNTER — Inpatient Hospital Stay: Attending: Family

## 2023-12-28 ENCOUNTER — Other Ambulatory Visit: Payer: Self-pay

## 2023-12-28 ENCOUNTER — Emergency Department (HOSPITAL_BASED_OUTPATIENT_CLINIC_OR_DEPARTMENT_OTHER)

## 2023-12-28 ENCOUNTER — Emergency Department (HOSPITAL_BASED_OUTPATIENT_CLINIC_OR_DEPARTMENT_OTHER)
Admission: EM | Admit: 2023-12-28 | Discharge: 2023-12-28 | Disposition: A | Discharge status: Home / Self Care | Attending: Emergency Medicine | Admitting: Emergency Medicine

## 2023-12-28 ENCOUNTER — Encounter (HOSPITAL_BASED_OUTPATIENT_CLINIC_OR_DEPARTMENT_OTHER): Payer: Self-pay | Admitting: Emergency Medicine

## 2023-12-28 DIAGNOSIS — R0789 Other chest pain: Secondary | ICD-10-CM | POA: Insufficient documentation

## 2023-12-28 DIAGNOSIS — D649 Anemia, unspecified: Secondary | ICD-10-CM | POA: Diagnosis not present

## 2023-12-28 DIAGNOSIS — R079 Chest pain, unspecified: Secondary | ICD-10-CM | POA: Diagnosis not present

## 2023-12-28 LAB — CBC WITH DIFFERENTIAL/PLATELET
Abs Immature Granulocytes: 0.03 10*3/uL (ref 0.00–0.07)
Basophils Absolute: 0.1 10*3/uL (ref 0.0–0.1)
Basophils Relative: 1 %
Eosinophils Absolute: 0.2 10*3/uL (ref 0.0–0.5)
Eosinophils Relative: 2 %
HCT: 36.7 % (ref 36.0–46.0)
Hemoglobin: 11.3 g/dL — ABNORMAL LOW (ref 12.0–15.0)
Immature Granulocytes: 0 %
Lymphocytes Relative: 26 %
Lymphs Abs: 2.5 10*3/uL (ref 0.7–4.0)
MCH: 22.2 pg — ABNORMAL LOW (ref 26.0–34.0)
MCHC: 30.8 g/dL (ref 30.0–36.0)
MCV: 72.1 fL — ABNORMAL LOW (ref 80.0–100.0)
Monocytes Absolute: 0.5 10*3/uL (ref 0.1–1.0)
Monocytes Relative: 5 %
Neutro Abs: 6.3 10*3/uL (ref 1.7–7.7)
Neutrophils Relative %: 66 %
Platelets: 340 10*3/uL (ref 150–400)
RBC: 5.09 MIL/uL (ref 3.87–5.11)
RDW: 23.2 % — ABNORMAL HIGH (ref 11.5–15.5)
Smear Review: NORMAL
WBC: 9.6 10*3/uL (ref 4.0–10.5)
nRBC: 0 % (ref 0.0–0.2)

## 2023-12-28 LAB — BASIC METABOLIC PANEL WITH GFR
Anion gap: 12 (ref 5–15)
BUN: 9 mg/dL (ref 6–20)
CO2: 23 mmol/L (ref 22–32)
Calcium: 9.1 mg/dL (ref 8.9–10.3)
Chloride: 104 mmol/L (ref 98–111)
Creatinine, Ser: 0.7 mg/dL (ref 0.44–1.00)
GFR, Estimated: >60 mL/min (ref >60)
Glucose, Bld: 93 mg/dL (ref 70–99)
Potassium: 3.6 mmol/L (ref 3.5–5.1)
Sodium: 139 mmol/L (ref 135–145)

## 2023-12-28 LAB — TROPONIN T, HIGH SENSITIVITY: Troponin T High Sensitivity: <15 ng/L (ref <19)

## 2023-12-28 LAB — PREGNANCY, URINE: Preg Test, Ur: NEGATIVE

## 2023-12-28 NOTE — ED Triage Notes (Signed)
 Pt c/o sharp LT CP that started tonight when she was braiding her daughter's hair; reports radiation to LUE; denies other sxs

## 2023-12-28 NOTE — Discharge Instructions (Signed)
 Take ibuprofen 600 mg every 6 hours as needed for pain.  Follow-up with primary doctor/cardiologist in the near future.  Return to the ER if symptoms significantly worsen or change.

## 2023-12-28 NOTE — ED Provider Notes (Signed)
 [facility] Provider Note   CSN: [id number] Arrival date & time: 12/28/23  2203     History  Chief Complaint  Patient presents with   Chest Pain    Noah Kateryna Grantham is a 33 y.o. female.  Patient is a 33 year old female presenting with complaints of chest discomfort.  This started at approximately 6:00 this evening while she was braiding her daughter's hair.  She describes a sharp pain to the left upper chest that comes and goes.  No shortness of breath, nausea, diaphoresis.  Patient has had similar pains in the past and has been worked up with EKG, lab work, and CAT scans, but no definitive causes been found.  She has been referred to cardiology by her primary doctor but this has not happened yet.  Patient does have a history of high blood pressure, but has no other cardiac risk factors.       Home Medications Prior to Admission medications   Medication Sig Start Date End Date Taking? Authorizing Provider  ferrous sulfate  325 (65 FE) MG tablet Take 1 tablet (325 mg total) by mouth 2 (two) times daily with a meal. 01/11/23   Vann, Jessica U, DO  lisinopril (ZESTRIL) 10 MG tablet Take 10 mg by mouth daily. 07/22/23   [provider]  Multiple Vitamin (MULTIVITAMIN) tablet Take 1 tablet by mouth daily.    [provider]  norethindrone (MICRONOR) 0.35 MG tablet Take 1 tablet by mouth daily. 07/22/23   [provider]      Allergies    Patient has no known allergies.    Review of Systems   Review of Systems  All other systems reviewed and are negative.   Physical Exam Updated Vital Signs BP (!) 159/104 (BP Location: Right Arm)   Pulse 75   Temp 97.7 F (36.5 C)   Resp 18   Ht 5\' 2"  (1.575 m)   Wt 77.1 kg   LMP 12/14/2023   SpO2 98%   BMI 31.09 kg/m  Physical Exam Vitals and nursing note reviewed.  Constitutional:      General: She is not in acute distress.    Appearance: She is  well-developed. She is not diaphoretic.  HENT:     Head: Normocephalic and atraumatic.  Cardiovascular:     Rate and Rhythm: Normal rate and regular rhythm.     Heart sounds: No murmur heard.    No friction rub. No gallop.  Pulmonary:     Effort: Pulmonary effort is normal. No respiratory distress.     Breath sounds: Normal breath sounds. No wheezing.  Abdominal:     General: Bowel sounds are normal. There is no distension.     Palpations: Abdomen is soft.     Tenderness: There is no abdominal tenderness.  Musculoskeletal:        General: Normal range of motion.     Cervical back: Normal range of motion and neck supple.  Skin:    General: Skin is warm and dry.  Neurological:     General: No focal deficit present.     Mental Status: She is alert and oriented to person, place, and time.     ED Results / Procedures / Treatments   Labs (all labs ordered are listed, but only abnormal results are displayed) Labs Reviewed  CBC WITH DIFFERENTIAL/PLATELET - Abnormal; Notable for the following components:      Result Value   Hemoglobin 11.3 (*)  MCV 72.1 (*)    MCH 22.2 (*)    RDW 23.2 (*)    All other components within normal limits  BASIC METABOLIC PANEL WITH GFR  PREGNANCY, URINE  TROPONIN T, HIGH SENSITIVITY  TROPONIN T, HIGH SENSITIVITY    EKG EKG Interpretation Date/Time:  Sunday Dec 28 2023 22:11:35 EDT Ventricular Rate:  86 PR Interval:  169 QRS Duration:  90 QT Interval:  370 QTC Calculation: 443 R Axis:   26  Text Interpretation: Sinus rhythm Confirmed by Lowery Rue [phone]) on 12/28/2023 10:45:50 PM  Radiology DG Chest 2 View Result Date: 12/28/2023 CLINICAL DATA:  Chest pain EXAM: CHEST - 2 VIEW COMPARISON:  08/27/2023 FINDINGS: The heart size and mediastinal contours are within normal limits. Both lungs are clear. The visualized skeletal structures are unremarkable. IMPRESSION: No active cardiopulmonary disease. Electronically Signed   By: Janeece Mechanic  M.D.   On: 12/28/2023 23:09    Procedures Procedures    Medications Ordered in ED Medications - No data to display  ED Course/ Medical Decision Making/ A&P  Patient presenting with complaints of chest pain as described in the HPI.  Patient arrives here with stable vital signs and is afebrile.  Physical examination is basically unremarkable.  Laboratory studies obtained including CBC, metabolic panel, and troponin, all of which are basically normal.  She is mildly anemic with hemoglobin of 11.4.  Chest x-ray showing no acute process.  I highly doubt a cardiac etiology.  Patient's symptoms are atypical and has had multiple workups into this.  I feel as though she can safely be discharged.  Her workup is negative including EKG and troponin.  To follow-up with primary doctor/cardiology.  Final Clinical Impression(s) / ED Diagnoses Final diagnoses:  None    Rx / DC Orders ED Discharge Orders     None         Orvilla Blander, MD 12/28/23 2350

## 2023-12-28 NOTE — ED Notes (Signed)
 Pt reports left upper chest pain-radiation to left upper extremity
